# Patient Record
Sex: Male | Born: 1950 | Race: White | Hispanic: No | Marital: Married | Smoking: Never smoker
Health system: Southern US, Academic
[De-identification: ages and names within clinical notes are randomized; demographics above are authoritative.]

## PROBLEM LIST (undated history)

## (undated) DIAGNOSIS — C801 Malignant (primary) neoplasm, unspecified: Secondary | ICD-10-CM

## (undated) DIAGNOSIS — R81 Glycosuria: Secondary | ICD-10-CM

## (undated) DIAGNOSIS — R6889 Other general symptoms and signs: Secondary | ICD-10-CM

## (undated) DIAGNOSIS — I10 Essential (primary) hypertension: Secondary | ICD-10-CM

## (undated) DIAGNOSIS — M1990 Unspecified osteoarthritis, unspecified site: Secondary | ICD-10-CM

## (undated) DIAGNOSIS — Z973 Presence of spectacles and contact lenses: Secondary | ICD-10-CM

## (undated) DIAGNOSIS — K219 Gastro-esophageal reflux disease without esophagitis: Secondary | ICD-10-CM

## (undated) DIAGNOSIS — E119 Type 2 diabetes mellitus without complications: Secondary | ICD-10-CM

## (undated) DIAGNOSIS — G5712 Meralgia paresthetica, left lower limb: Secondary | ICD-10-CM

## (undated) DIAGNOSIS — R809 Proteinuria, unspecified: Secondary | ICD-10-CM

## (undated) DIAGNOSIS — K5900 Constipation, unspecified: Secondary | ICD-10-CM

## (undated) DIAGNOSIS — J0101 Acute recurrent maxillary sinusitis: Secondary | ICD-10-CM

## (undated) DIAGNOSIS — I219 Acute myocardial infarction, unspecified: Secondary | ICD-10-CM

## (undated) DIAGNOSIS — M539 Dorsopathy, unspecified: Secondary | ICD-10-CM

## (undated) DIAGNOSIS — C44519 Basal cell carcinoma of skin of other part of trunk: Secondary | ICD-10-CM

## (undated) DIAGNOSIS — M1732 Unilateral post-traumatic osteoarthritis, left knee: Secondary | ICD-10-CM

## (undated) HISTORY — DX: Essential (primary) hypertension: I10

## (undated) HISTORY — DX: Acute recurrent maxillary sinusitis: J01.01

## (undated) HISTORY — PX: CATARACT EXTRACTION: SUR2

## (undated) HISTORY — PX: HX KNEE REPLACMENT: SHX125

## (undated) HISTORY — DX: Meralgia paresthetica, left lower limb: G57.12

## (undated) HISTORY — PX: HX BACK SURGERY: SHX140

## (undated) HISTORY — DX: Unilateral post-traumatic osteoarthritis, left knee: M17.32

## (undated) HISTORY — DX: Proteinuria, unspecified: R80.9

## (undated) HISTORY — DX: Basal cell carcinoma of skin of other part of trunk: C44.519

## (undated) HISTORY — PX: HX APPENDECTOMY: SHX54

## (undated) HISTORY — DX: Glycosuria: R81

## (undated) HISTORY — DX: Gastro-esophageal reflux disease without esophagitis: K21.9

---

## 1993-10-12 ENCOUNTER — Other Ambulatory Visit (HOSPITAL_COMMUNITY): Payer: Self-pay | Admitting: EXTERNAL

## 2009-07-02 DIAGNOSIS — M545 Low back pain, unspecified: Secondary | ICD-10-CM | POA: Insufficient documentation

## 2009-07-02 DIAGNOSIS — M542 Cervicalgia: Secondary | ICD-10-CM | POA: Insufficient documentation

## 2009-07-12 DIAGNOSIS — M48062 Spinal stenosis, lumbar region with neurogenic claudication: Secondary | ICD-10-CM | POA: Insufficient documentation

## 2009-07-12 DIAGNOSIS — M47817 Spondylosis without myelopathy or radiculopathy, lumbosacral region: Secondary | ICD-10-CM | POA: Insufficient documentation

## 2009-07-12 DIAGNOSIS — M48061 Spinal stenosis, lumbar region without neurogenic claudication: Secondary | ICD-10-CM | POA: Insufficient documentation

## 2009-07-12 DIAGNOSIS — M5137 Other intervertebral disc degeneration, lumbosacral region: Secondary | ICD-10-CM | POA: Insufficient documentation

## 2013-12-22 DIAGNOSIS — I10 Essential (primary) hypertension: Secondary | ICD-10-CM | POA: Insufficient documentation

## 2013-12-22 DIAGNOSIS — E119 Type 2 diabetes mellitus without complications: Secondary | ICD-10-CM | POA: Insufficient documentation

## 2013-12-23 NOTE — Unmapped External Note (Signed)
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

## 2014-01-16 DIAGNOSIS — Z9889 Other specified postprocedural states: Secondary | ICD-10-CM | POA: Insufficient documentation

## 2015-01-29 DIAGNOSIS — M5116 Intervertebral disc disorders with radiculopathy, lumbar region: Secondary | ICD-10-CM | POA: Insufficient documentation

## 2016-10-01 DIAGNOSIS — Z96652 Presence of left artificial knee joint: Secondary | ICD-10-CM | POA: Insufficient documentation

## 2018-12-22 NOTE — Progress Notes (Signed)
Two year f/u left knee clinical outcomes data collected on 12/08/2018.
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
## 2021-08-01 IMAGING — MR MRI SHOULDER LT W/O CONTRAST
5 series · 40 of 40 positions shown · IV contrast (gadolinium)
Comparison: Radiographs dated 05/20/2021.

﻿EXAM:  [id number]   MRI SHOULDER LT W/O CONTRAST
INDICATION: Pain.
TECHNIQUE: Multiplanar multisequential MRI of the left shoulder joint was performed without gadolinium contrast.

[Series 6: T1 · oblique · left · 3.5mm · 0.33mm/px · 8 of 18 slices shown]
[im 1/18]
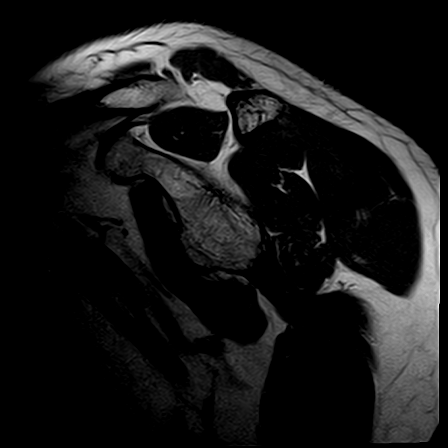
[im 3/18]
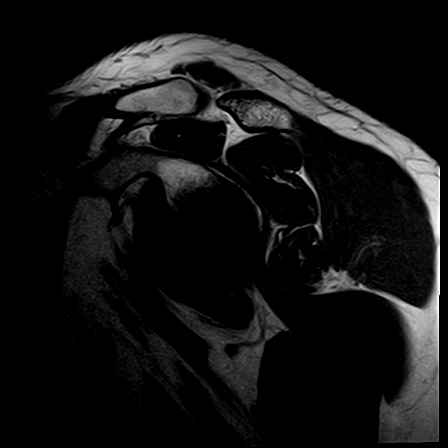
[im 5/18]
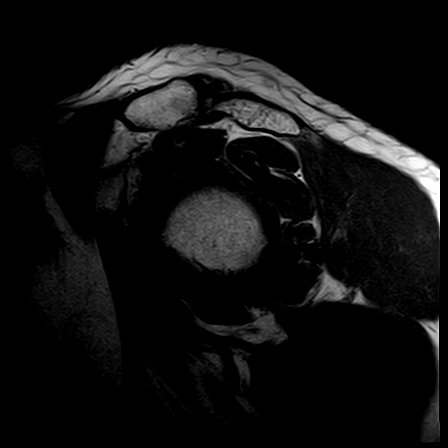
[im 8/18]
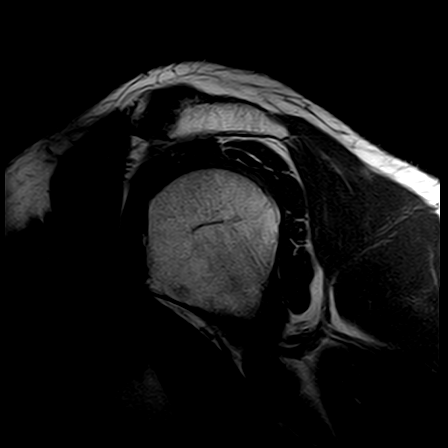
[im 10/18]
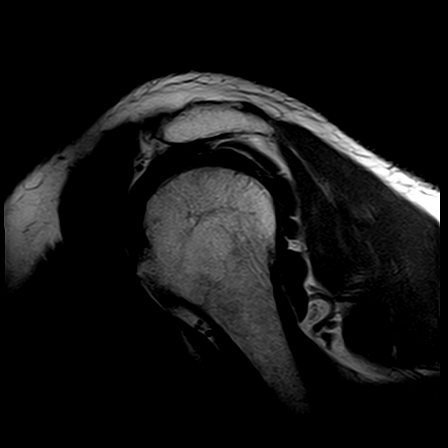
[im 13/18]
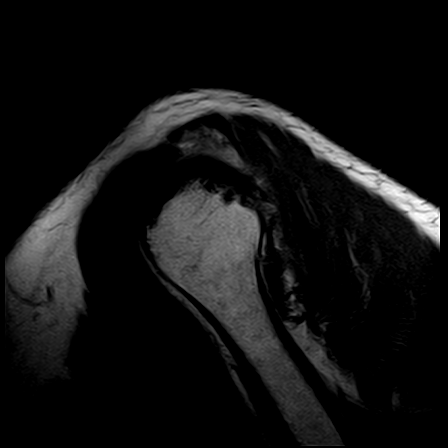
[im 15/18]
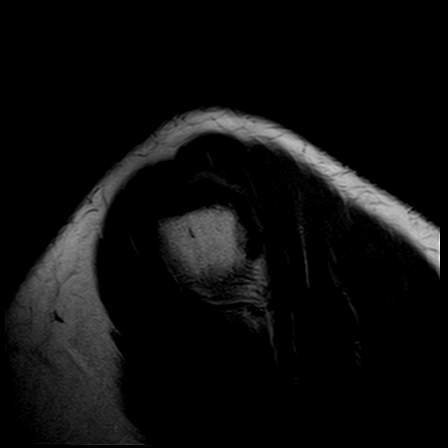
[im 18/18]
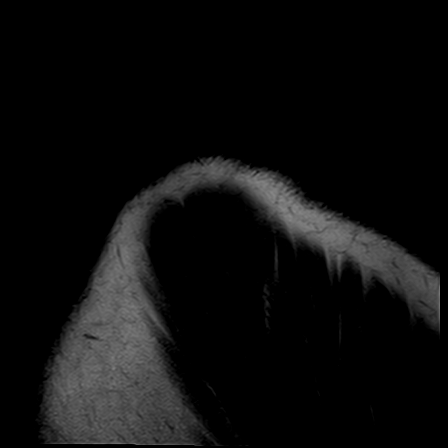

[Series 7: STIR · oblique · left · 3.5mm · 0.47mm/px · 8 of 18 slices shown (1 of 2)]
[im 1/18]
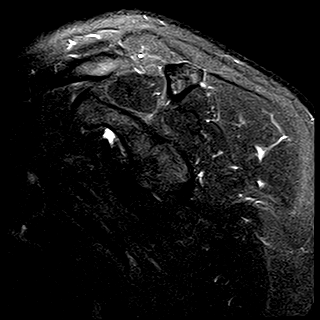
[im 3/18]
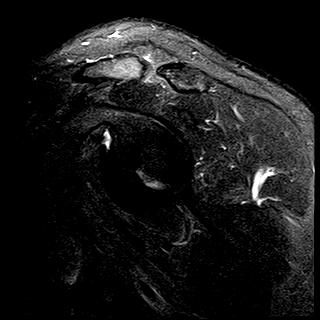
[im 5/18]
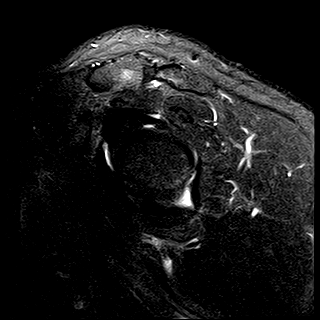
[im 8/18]
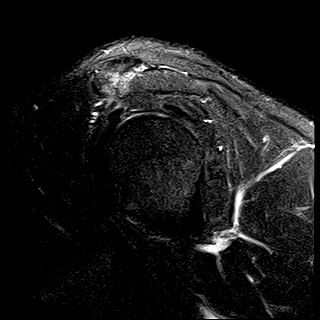
[im 10/18]
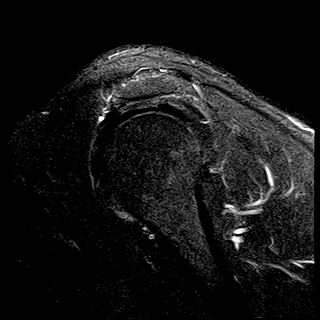
[im 13/18]
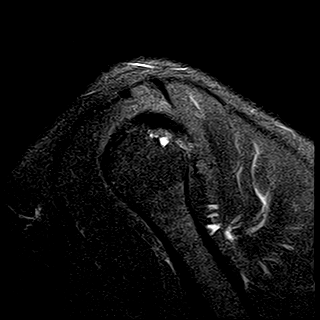
[im 15/18]
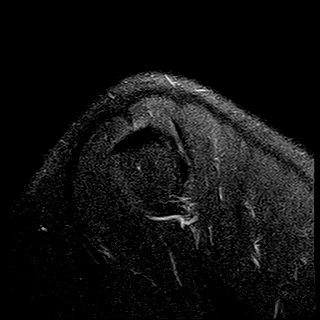
[im 18/18]
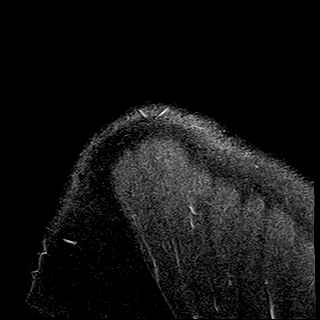

[Series 10: PD fat-sat · oblique · left · 3.5mm · 0.47mm/px · 8 of 18 slices shown]
[im 1/18]
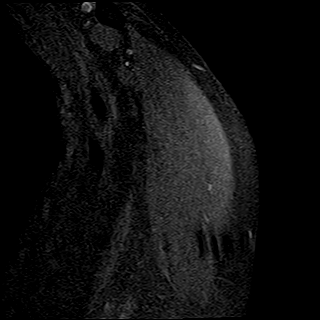
[im 3/18]
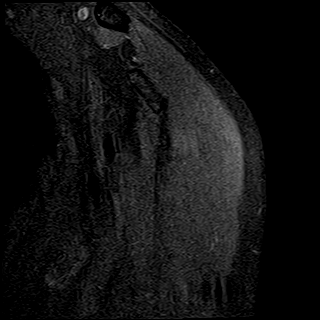
[im 5/18]
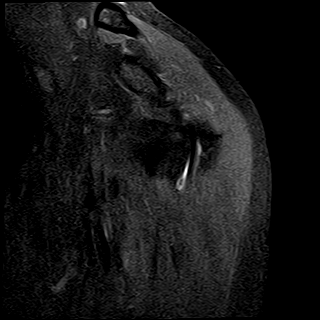
[im 8/18]
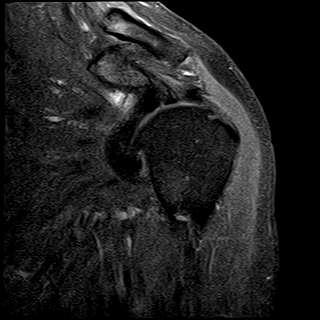
[im 10/18]
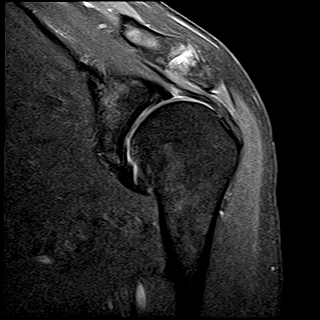
[im 13/18]
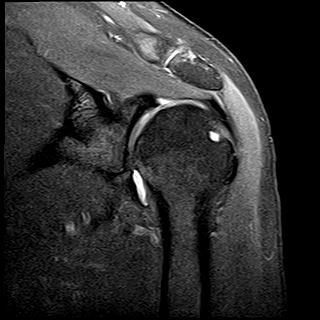
[im 15/18]
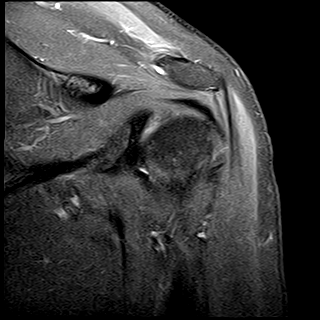
[im 18/18]
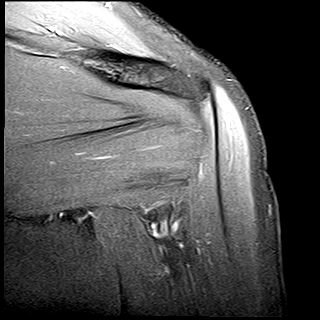

[Series 11: STIR · oblique · left · 3.5mm · 0.47mm/px · 8 of 18 slices shown (2 of 2)]
[im 1/18]
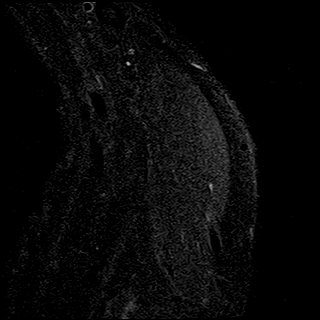
[im 3/18]
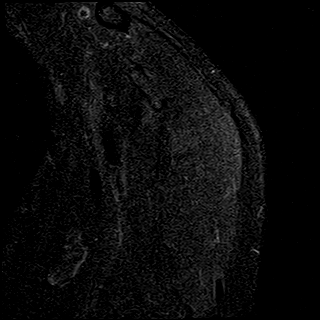
[im 5/18]
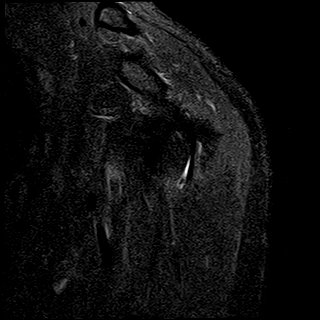
[im 8/18]
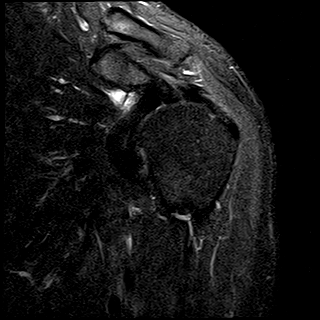
[im 10/18]
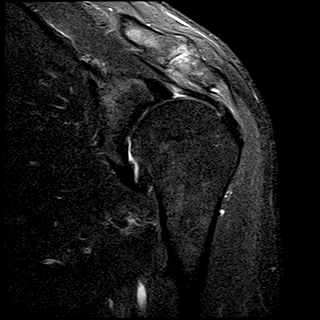
[im 13/18]
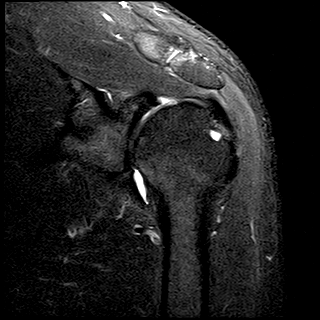
[im 15/18]
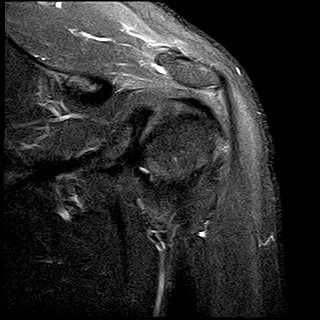
[im 18/18]
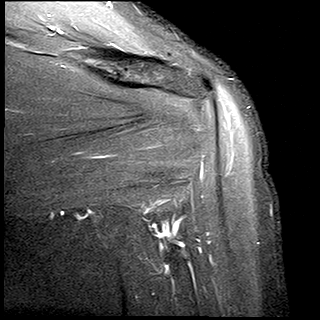

[Series 12: PD · oblique · left · 3.5mm · 0.47mm/px · 8 of 18 slices shown]
[im 1/18]
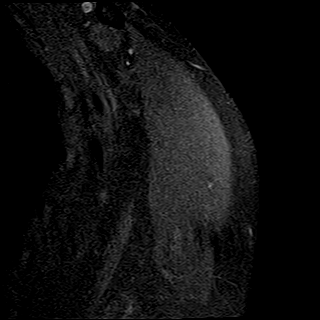
[im 3/18]
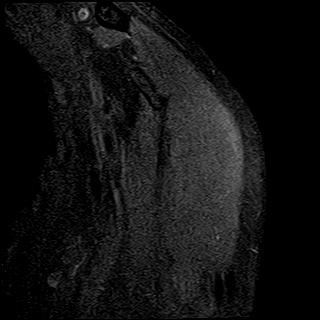
[im 5/18]
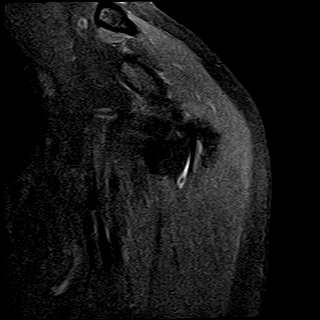
[im 8/18]
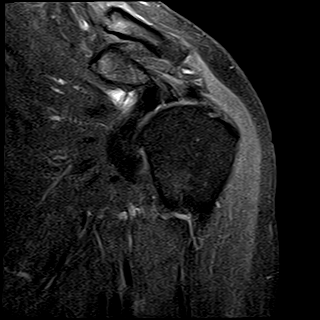
[im 10/18]
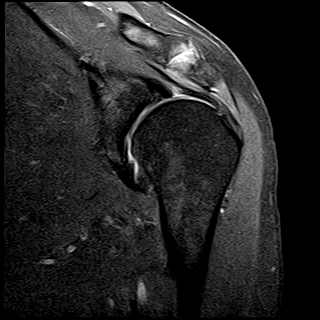
[im 13/18]
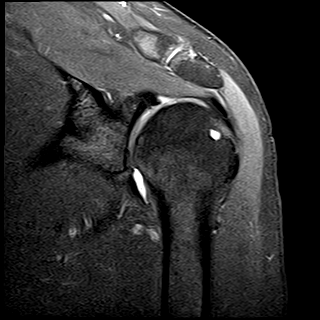
[im 15/18]
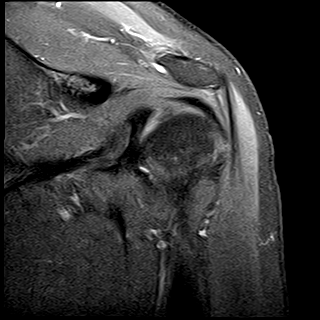
[im 18/18]
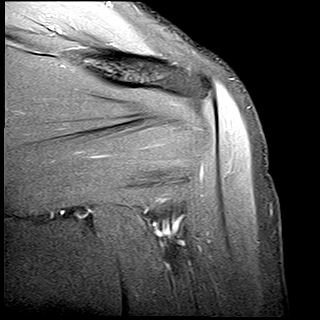

[40 of 40 positions shown; findings below may reference images not displayed]

FINDINGS: Supraspinatus, infraspinatus, teres minor and subscapularis muscles and tendons are within normal limits in morphology and signal intensity. Long head of biceps tendon is well seated within the intertubercular groove and attaches normally to the biceps anchor. Superior labrum is intact. Glenohumeral articular cartilage is well maintained.  There is advanced acromioclavicular joint osteoarthritis.  No significant fluid is noted within the subacromial/subdeltoid bursa. Muscle bulk and bone marrow signal intensity are normal. No mass is seen along the course of the suprascapular nerve, within the spinoglenoid notch or within the quadrilateral space.
IMPRESSION: 1. Intact rotator cuff and superior labrum. 

2. Advanced acromioclavicular joint osteoarthritis.

## 2021-08-01 IMAGING — MR MRI SHOULDER RT W/O CONTRAST
6 series · 38 of 40 positions shown · IV contrast (gadolinium)
Comparison: Radiographs dated 05/20/2021.

﻿EXAM:  [id number]   MRI SHOULDER RT W/O CONTRAST
INDICATION: Chronic pain and decreased range of motion.
TECHNIQUE: Multiplanar multisequential MRI of the right shoulder joint was performed without gadolinium contrast.

[Series 6: T1 · oblique · right · 3.5mm · 0.33mm/px · 7 of 18 slices shown]
[im 1/18]
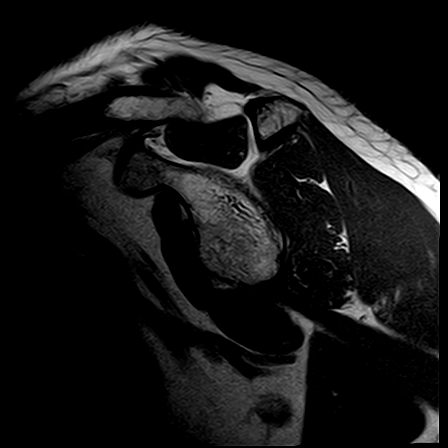
[im 3/18]
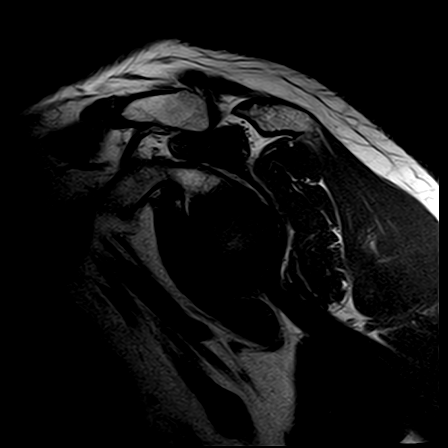
[im 6/18]
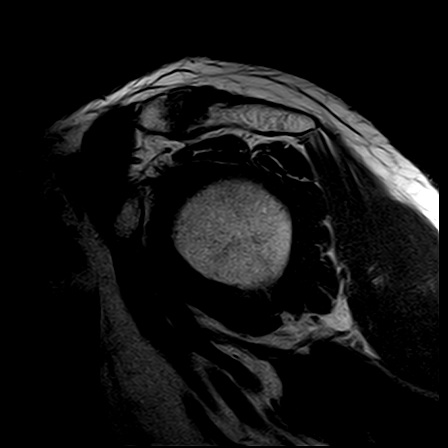
[im 9/18]
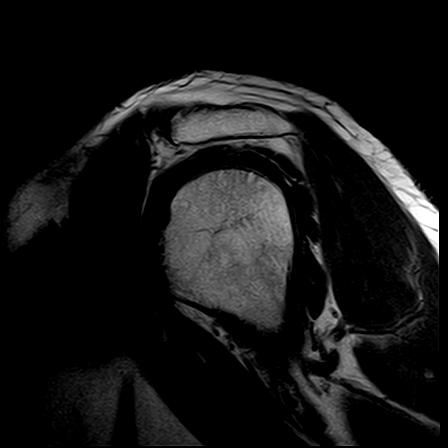
[im 12/18]
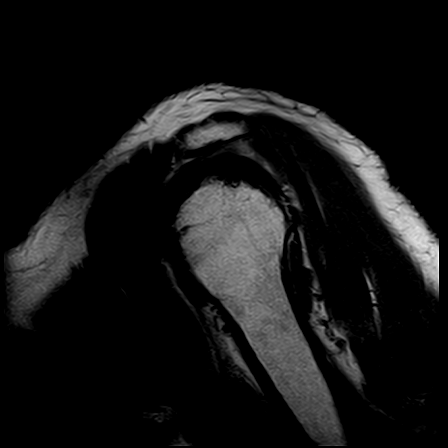
[im 15/18]
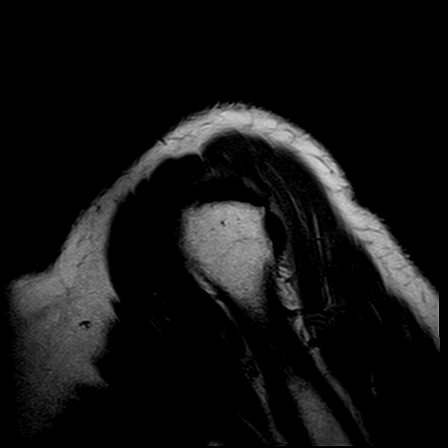
[im 18/18]
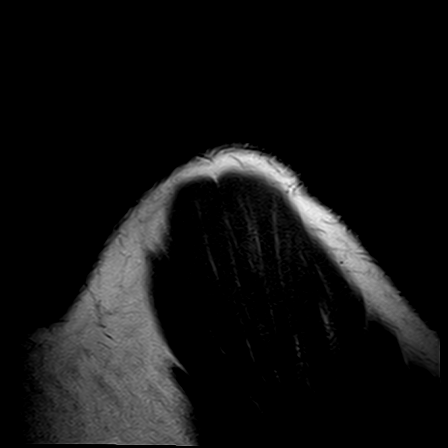

[Series 7: STIR · oblique · right · 3.5mm · 0.47mm/px · 7 of 18 slices shown (1 of 2)]
[im 1/18]
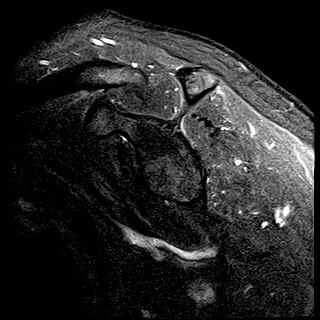
[im 3/18]
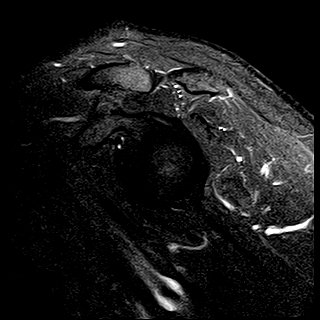
[im 6/18]
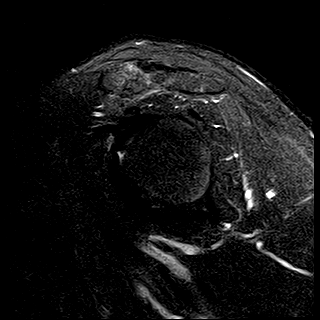
[im 9/18]
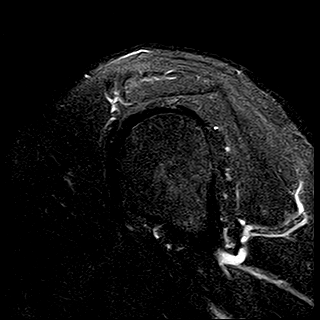
[im 12/18]
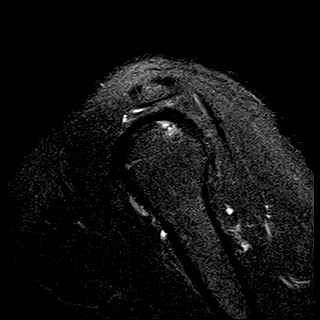
[im 15/18]
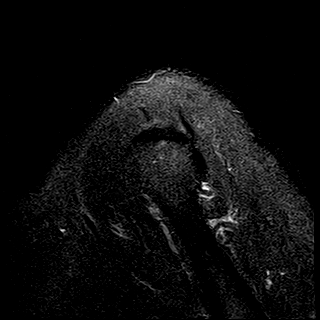
[im 18/18]
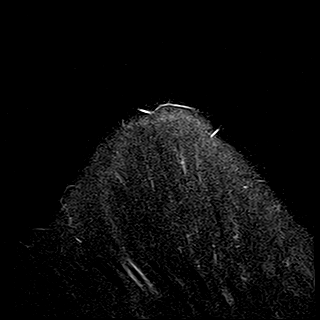

[Series 8: PD fat-sat · axial · right · 4.0mm · 0.50mm/px · z∈[-62,+14]mm · 6 of 18 slices shown (1 of 2)]
[im 1/18]
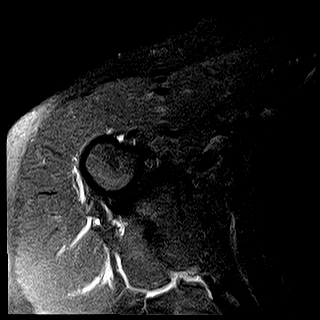
[im 4/18]
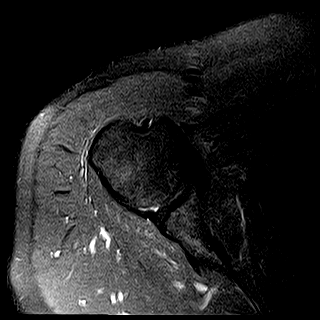
[im 7/18]
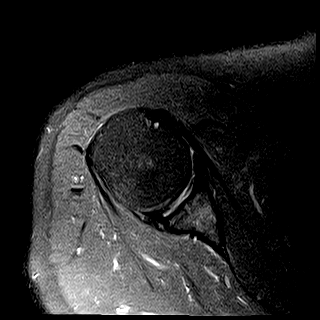
[im 11/18]
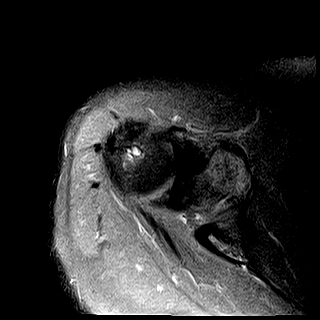
[im 14/18]
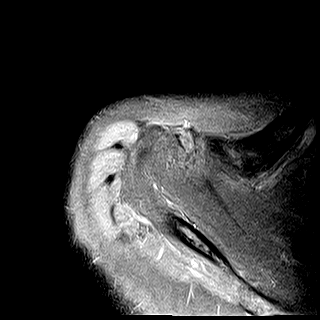
[im 18/18]
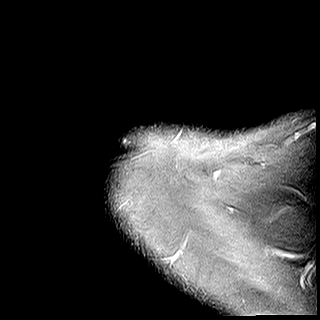

[Series 9: T2 fat-sat · axial · right · 4.0mm · 0.42mm/px · z∈[-76,+28]mm · 8 of 24 slices shown]
[im 1/24]
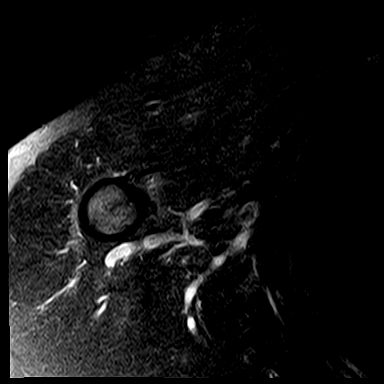
[im 4/24]
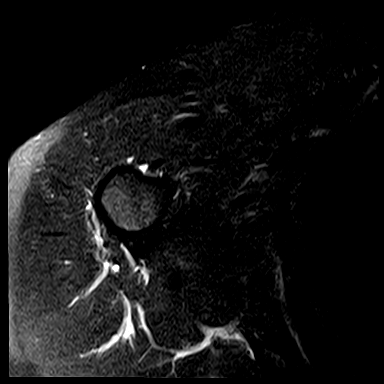
[im 7/24]
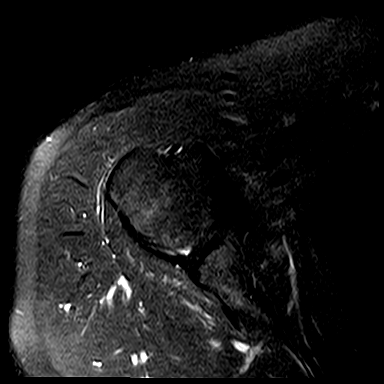
[im 10/24]
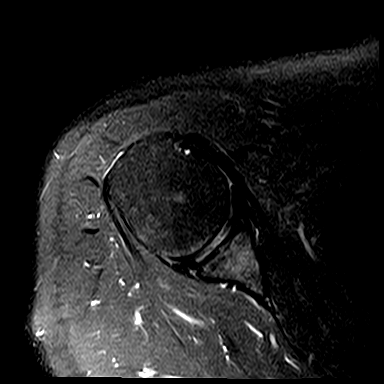
[im 14/24]
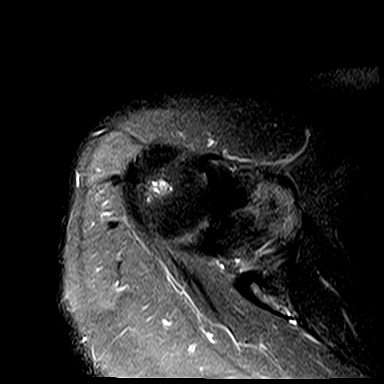
[im 17/24]
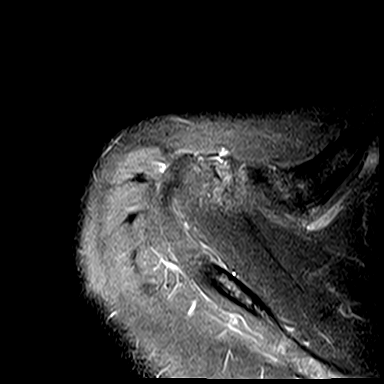
[im 20/24]
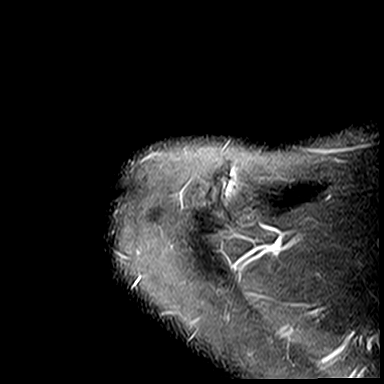
[im 24/24]
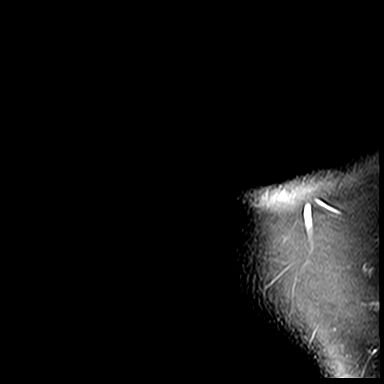

[Series 10: PD fat-sat · oblique · right · 3.5mm · 0.50mm/px · 6 of 18 slices shown (2 of 2)]
[im 1/18]
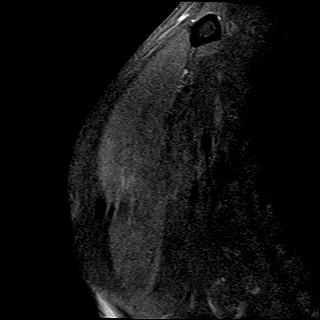
[im 4/18]
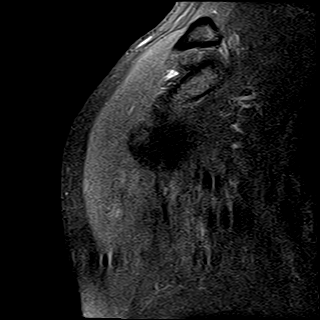
[im 7/18]
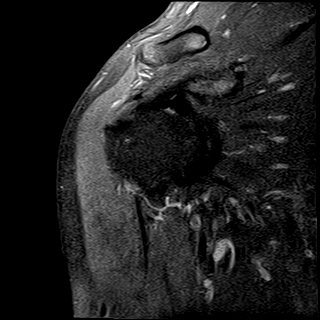
[im 11/18]
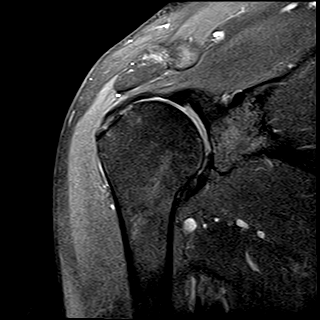
[im 14/18]
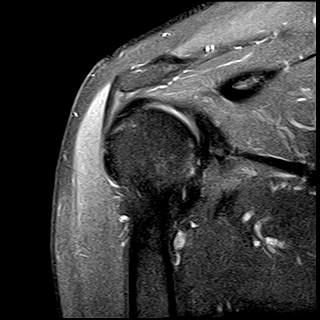
[im 18/18]
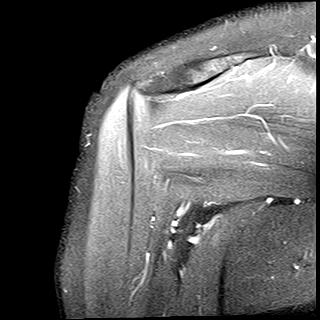

[Series 11: STIR · oblique · right · 3.5mm · 0.50mm/px · 4 of 18 slices shown (2 of 2)]
[im 1/18]
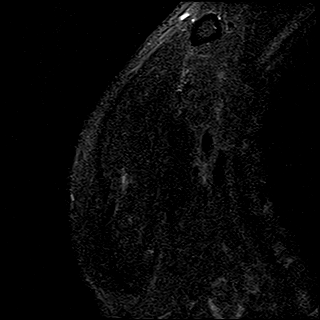
[im 4/18]
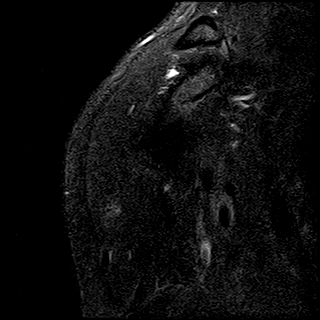
[im 7/18]
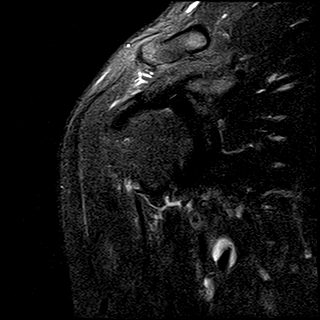
[im 11/18]
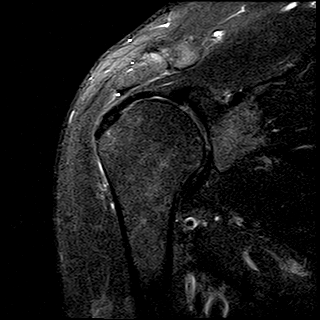

[38 of 40 positions shown; findings below may reference images not displayed]

FINDINGS: Supraspinatus, infraspinatus, teres minor and subscapularis muscles and tendons are within normal limits in morphology and signal intensity. Long head of biceps tendon is well seated within the intertubercular groove and attaches normally to the biceps anchor. Superior labrum is intact. There is suggestion of posterior labral tear. Glenohumeral articular cartilage is well maintained. There is moderate acromioclavicular joint osteoarthritis.  No significant fluid is noted within the subacromial/subdeltoid bursa. Muscle bulk and bone marrow signal intensity are normal. Mild subcortical cystic changes seen within the humeral head underlying infraspinatus tendon insertion.  No mass is seen along the course of the suprascapular nerve, within the spinoglenoid notch or within the quadrilateral space.
IMPRESSION: 1. Intact rotator cuff and superior labrum. 

2. Suggestion of posterior labral tear. 

3. Moderate acromioclavicular joint osteoarthritis.

## 2021-08-07 ENCOUNTER — Other Ambulatory Visit (INDEPENDENT_AMBULATORY_CARE_PROVIDER_SITE_OTHER): Payer: Self-pay | Admitting: Family

## 2021-08-07 DIAGNOSIS — R319 Hematuria, unspecified: Secondary | ICD-10-CM

## 2021-09-23 ENCOUNTER — Ambulatory Visit (HOSPITAL_COMMUNITY): Payer: Self-pay

## 2022-01-02 ENCOUNTER — Encounter (INDEPENDENT_AMBULATORY_CARE_PROVIDER_SITE_OTHER): Payer: Self-pay | Admitting: Family Medicine

## 2022-01-02 DIAGNOSIS — L821 Other seborrheic keratosis: Secondary | ICD-10-CM | POA: Insufficient documentation

## 2022-01-02 DIAGNOSIS — E538 Deficiency of other specified B group vitamins: Secondary | ICD-10-CM | POA: Insufficient documentation

## 2022-01-02 DIAGNOSIS — H18609 Keratoconus, unspecified, unspecified eye: Secondary | ICD-10-CM | POA: Insufficient documentation

## 2022-01-02 DIAGNOSIS — L03113 Cellulitis of right upper limb: Secondary | ICD-10-CM | POA: Insufficient documentation

## 2022-01-02 DIAGNOSIS — R3129 Other microscopic hematuria: Secondary | ICD-10-CM | POA: Insufficient documentation

## 2022-01-02 DIAGNOSIS — M2040 Other hammer toe(s) (acquired), unspecified foot: Secondary | ICD-10-CM | POA: Insufficient documentation

## 2022-01-02 DIAGNOSIS — E113299 Type 2 diabetes mellitus with mild nonproliferative diabetic retinopathy without macular edema, unspecified eye: Secondary | ICD-10-CM | POA: Insufficient documentation

## 2022-01-02 DIAGNOSIS — R002 Palpitations: Secondary | ICD-10-CM | POA: Insufficient documentation

## 2022-01-02 DIAGNOSIS — H903 Sensorineural hearing loss, bilateral: Secondary | ICD-10-CM | POA: Insufficient documentation

## 2022-01-02 DIAGNOSIS — S86819A Strain of other muscle(s) and tendon(s) at lower leg level, unspecified leg, initial encounter: Secondary | ICD-10-CM | POA: Insufficient documentation

## 2022-01-02 DIAGNOSIS — E1140 Type 2 diabetes mellitus with diabetic neuropathy, unspecified: Secondary | ICD-10-CM | POA: Insufficient documentation

## 2022-01-02 DIAGNOSIS — M5010 Cervical disc disorder with radiculopathy, unspecified cervical region: Secondary | ICD-10-CM | POA: Insufficient documentation

## 2022-01-02 DIAGNOSIS — G629 Polyneuropathy, unspecified: Secondary | ICD-10-CM | POA: Insufficient documentation

## 2022-01-02 DIAGNOSIS — N649 Disorder of breast, unspecified: Secondary | ICD-10-CM | POA: Insufficient documentation

## 2022-01-02 DIAGNOSIS — F32A Depression, unspecified: Secondary | ICD-10-CM | POA: Insufficient documentation

## 2022-01-02 DIAGNOSIS — M5126 Other intervertebral disc displacement, lumbar region: Secondary | ICD-10-CM | POA: Insufficient documentation

## 2022-01-02 DIAGNOSIS — R202 Paresthesia of skin: Secondary | ICD-10-CM | POA: Insufficient documentation

## 2022-01-02 DIAGNOSIS — M1990 Unspecified osteoarthritis, unspecified site: Secondary | ICD-10-CM | POA: Insufficient documentation

## 2022-01-02 DIAGNOSIS — M4310 Spondylolisthesis, site unspecified: Secondary | ICD-10-CM | POA: Insufficient documentation

## 2022-01-02 DIAGNOSIS — E559 Vitamin D deficiency, unspecified: Secondary | ICD-10-CM | POA: Insufficient documentation

## 2022-01-02 DIAGNOSIS — L602 Onychogryphosis: Secondary | ICD-10-CM | POA: Insufficient documentation

## 2022-01-02 DIAGNOSIS — N400 Enlarged prostate without lower urinary tract symptoms: Secondary | ICD-10-CM | POA: Insufficient documentation

## 2022-01-02 DIAGNOSIS — K648 Other hemorrhoids: Secondary | ICD-10-CM | POA: Insufficient documentation

## 2022-01-02 DIAGNOSIS — R42 Dizziness and giddiness: Secondary | ICD-10-CM | POA: Insufficient documentation

## 2022-01-02 DIAGNOSIS — L603 Nail dystrophy: Secondary | ICD-10-CM | POA: Insufficient documentation

## 2022-01-02 DIAGNOSIS — N401 Enlarged prostate with lower urinary tract symptoms: Secondary | ICD-10-CM | POA: Insufficient documentation

## 2022-01-02 DIAGNOSIS — D126 Benign neoplasm of colon, unspecified: Secondary | ICD-10-CM | POA: Insufficient documentation

## 2022-01-02 DIAGNOSIS — Z96659 Presence of unspecified artificial knee joint: Secondary | ICD-10-CM | POA: Insufficient documentation

## 2022-01-02 DIAGNOSIS — F5221 Male erectile disorder: Secondary | ICD-10-CM | POA: Insufficient documentation

## 2022-01-02 DIAGNOSIS — B351 Tinea unguium: Secondary | ICD-10-CM | POA: Insufficient documentation

## 2022-01-02 DIAGNOSIS — S21201A Unspecified open wound of right back wall of thorax without penetration into thoracic cavity, initial encounter: Secondary | ICD-10-CM | POA: Insufficient documentation

## 2022-01-02 DIAGNOSIS — Z4789 Encounter for other orthopedic aftercare: Secondary | ICD-10-CM | POA: Insufficient documentation

## 2022-01-02 DIAGNOSIS — L600 Ingrowing nail: Secondary | ICD-10-CM | POA: Insufficient documentation

## 2022-01-08 ENCOUNTER — Ambulatory Visit (INDEPENDENT_AMBULATORY_CARE_PROVIDER_SITE_OTHER): Payer: Commercial Managed Care - PPO | Admitting: Family Medicine

## 2022-01-08 ENCOUNTER — Other Ambulatory Visit: Payer: Self-pay

## 2022-01-08 ENCOUNTER — Encounter (INDEPENDENT_AMBULATORY_CARE_PROVIDER_SITE_OTHER): Payer: Self-pay | Admitting: Family Medicine

## 2022-01-08 VITALS — BP 147/73 | HR 73 | Temp 98.5°F | Resp 20 | Ht 73.0 in | Wt 203.0 lb

## 2022-01-08 DIAGNOSIS — E538 Deficiency of other specified B group vitamins: Secondary | ICD-10-CM

## 2022-01-08 DIAGNOSIS — Z794 Long term (current) use of insulin: Secondary | ICD-10-CM

## 2022-01-08 DIAGNOSIS — F339 Major depressive disorder, recurrent, unspecified: Secondary | ICD-10-CM

## 2022-01-08 DIAGNOSIS — E119 Type 2 diabetes mellitus without complications: Secondary | ICD-10-CM

## 2022-01-08 DIAGNOSIS — E782 Mixed hyperlipidemia: Secondary | ICD-10-CM

## 2022-01-08 DIAGNOSIS — F5221 Male erectile disorder: Secondary | ICD-10-CM

## 2022-01-08 NOTE — Progress Notes (Signed)
FAMILY MEDICINE, MEDICAL OFFICE BUILDING  [address]  [place] [address]       Name: Brian Stanley MRN:  [id number]   Date: 01/08/2022 Age: 71 y.o.          Provider: Elliot Gault, DO    Reason for visit: Follow Up 6 Months      History of Present Illness:  01/08/2022:  This 71 year old male cancer six-month follow-up reviewed his labs and get refills all his medications.  He has absent [employer] last week and his A1c dropped from 6.8-6.6 per the patient.  We do not have the labs he had done last week so were going to try to get this.  His blood pressure last week was 94/54 city reduce lisinopril from 10 mg to 5 mg.  He has lost 6 lb since he was here last.  His major complaint today is arthritis pain in his hands mostly the metacarpophalangeal joints.  He denies chest pain shortness a breath syncope near syncope nausea vomiting diarrhea constipation.  He continues to follow with Urology for his microscopic hematuria.  He discontinued the Cialis because it was not helping with his benign prostatic hyperplasia.  Historical Data    Past Medical History:  Past Medical History:   Diagnosis Date   . Acute recurrent maxillary sinusitis    . Basal cell carcinoma of back    . Esophageal reflux    . Glycosuria    . Hypertension    . Meralgia paresthetica of left side    . Post-traumatic osteoarthritis of left knee    . Proteinuria          Past Surgical History:  Past Surgical History:   Procedure Laterality Date   . HX APPENDECTOMY     . HX BACK SURGERY     . HX KNEE REPLACMENT      left         Allergies:  Allergies   Allergen Reactions   . Atorvastatin Myalgia     Other reaction(s): Muscle pain   . Milk  Other Adverse Reaction (Add comment)     Lactose Intolerance? GI symptoms   . Pravastatin Myalgia     Other reaction(s): Muscle pain   . Rosuvastatin Myalgia     Other reaction(s): Muscle pain   . Sildenafil  Other Adverse Reaction (Add comment)     Other reaction(s):  Headache   . Simvastatin Myalgia     Other reaction(s): Muscle pain   . Metformin Diarrhea     Medications:  Current Outpatient Medications   Medication Sig   . aspirin (ECOTRIN) 81 mg Oral Tablet, Delayed Release (E.C.) Take 1 Tablet (81 mg total) by mouth   . CHELATED ZINC ORAL Take 50 mg by mouth Once a day   . empagliflozin (JARDIANCE) 25 mg Oral Tablet Take 1 Tablet (25 mg total) by mouth Every morning   . Folic Acid 767 mcg Oral Tablet Take 1 Tablet (800 mcg total) by mouth   . insulin glargine-yfgn 100 Units/mL Subcutaneous Insulin Pen INJECT 25 UNITS SUBCUTANEOUSLY EVERY DAY   . krill/omega-3/dha/epa/lipids (MEGAKRILL ORAL) Take by mouth Once a day   . lidocaine (LIDODERM) 5 % Adhesive Patch, Medicated Place on the skin   . lisinopriL (PRINIVIL) 10 mg Oral Tablet Take 0.5 Tablets (5 mg total) by mouth Once a day   . MULTIVITAMIN ORAL TAKE  BY MOUTH EVERY DAY   . naproxen (NAPROSYN) 500 mg Oral Tablet Take  1 Tablet (500 mg total) by mouth Twice daily with food   . semaglutide (OZEMPIC) 1 mg/dose (4 mg/3 mL) Subcutaneous Pen Injector INJECT '1MG'$ /0.75ML SUBCUTANEOUSLY ONCE A WEEK   . Simethicone 125 mg Oral Capsule Take 1 Capsule (125 mg total) by mouth Once a day   . vitamin E 670 mg (1,000 unit) Oral Capsule Take 1 Capsule (1,000 Units total) by mouth Twice daily     Family History:  Family Medical History:     Problem Relation (Age of Onset)    Carpal tunnel syndrome Sister    Depression Mother    Diabetes type II Father    Elevated Lipids Father    Heart Attack Father    Hypertension (High Blood Pressure) Father    Stroke Mother          Social History:  Social History     Socioeconomic History   . Marital status: Married   Tobacco Use   . Smoking status: Never   . Smokeless tobacco: Never   Vaping Use   . Vaping Use: Never used   Substance and Sexual Activity   . Alcohol use: Yes     Comment: occasionally   . Drug use: Never           Review of Systems:  Any pertinent Review of Systems as addressed in the  HPI above.    Physical Exam:  Vital Signs:  Vitals:    01/08/22 0817   BP: (!) 147/73   Pulse: 73   Resp: 20   Temp: 36.9 C (98.5 F)   TempSrc: Temporal   SpO2: 95%   Weight: 92.1 kg (203 lb)   Height: 1.854 m ('6\' 1"'$ )   BMI: 26.84     Physical Exam  Constitutional:       Appearance: Normal appearance. He is overweight.      Comments: BMI is 26.78 kilograms/meter squared this is overweight.   HENT:      Head: Normocephalic and atraumatic.      Right Ear: Tympanic membrane, ear canal and external ear normal.      Left Ear: Tympanic membrane, ear canal and external ear normal.      Nose: Nose normal.      Mouth/Throat:      Mouth: Mucous membranes are moist.      Pharynx: Oropharynx is clear.   Eyes:      Extraocular Movements: Extraocular movements intact.      Conjunctiva/sclera: Conjunctivae normal.      Pupils: Pupils are equal, round, and reactive to light.   Cardiovascular:      Rate and Rhythm: Normal rate and regular rhythm.      Pulses: Normal pulses.      Heart sounds: Normal heart sounds.   Pulmonary:      Effort: Pulmonary effort is normal.      Breath sounds: Normal breath sounds.   Abdominal:      General: Abdomen is flat.      Palpations: Abdomen is soft.   Musculoskeletal:         General: Normal range of motion.      Right hand: Swelling, tenderness and bony tenderness present.      Left hand: Swelling, tenderness and bony tenderness present.      Cervical back: Normal range of motion and neck supple.   Skin:     General: Skin is warm and dry.      Capillary Refill: Capillary refill takes less than  2 seconds.   Neurological:      General: No focal deficit present.      Mental Status: He is alert and oriented to person, place, and time. Mental status is at baseline.   Psychiatric:         Mood and Affect: Mood normal.         Behavior: Behavior normal.       Assessment:    ICD-10-CM    1. 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 Major depression, recurrent (CMS HCC)  F33.9       4. Vitamin B12 deficiency (non anemic)  E53.8       5. Hyperlipidemia, mixed  E78.2          Plan:  Orders Placed This Encounter   . BASIC METABOLIC PANEL   . HGA1C (HEMOGLOBIN A1C WITH EST AVG GLUCOSE)   . LIPID PANEL   . HEPATIC FUNCTION PANEL   . THYROID STIMULATING HORMONE (SENSITIVE TSH)   . MICROALBUMIN/CREATININE RATIO, URINE, RANDOM     He will have his labs done to be a Pap who ordered a basic metabolic panel hemoglobin A1c lipid panel hepatic function panel thyroid-stimulating hormone microalbumin creatinine ratio to be done in 6 months he does not have labs to be 8.  He is to stay on a heart healthy low-fat low-cholesterol diet and avoid high fructose corn syrup and concentrated is sweets.  He says he is going to try, liver oral for his joint pain.  He can also try glucosamine chondroitin sulfate and bulimia new or given Voltaren gel over-the-counter.  His blood sugars are doing much better.  I encouraged him to continue his excellent weight loss.  His depression is under good control he will continue supplementing with B12 at some point as he was awake he may need to start taking brain off of the insulin.  More than 50% of the visit was spent counseling coordinating patient care.  All questions were answered to satisfaction of the patient.    Return in about 6 months (around 07/11/2022).    Elliot Gault, DO     Portions of this note may be dictated using voice recognition software or a dictation service. Variances in spelling and vocabulary are possible and unintentional. Not all errors are caught/corrected. Please notify the [facility] if any discrepancies are noted or if the meaning of any statement is not clear.

## 2022-01-08 NOTE — Nursing Note (Signed)
01/08/22 0824   PHQ 9 (follow up)   Little interest or pleasure in doing things. 0   Feeling down, depressed, or hopeless 0

## 2022-01-08 NOTE — Nursing Note (Signed)
01/08/22 0824   Fall Risk Assessment   Do you feel unsteady when standing or walking? Yes   Do you worry about falling? Yes   Have you fallen in the past year? No

## 2022-01-27 ENCOUNTER — Encounter (INDEPENDENT_AMBULATORY_CARE_PROVIDER_SITE_OTHER): Payer: Self-pay | Admitting: Family

## 2022-02-05 ENCOUNTER — Ambulatory Visit (INDEPENDENT_AMBULATORY_CARE_PROVIDER_SITE_OTHER): Payer: Commercial Managed Care - PPO | Admitting: Family Medicine

## 2022-02-05 ENCOUNTER — Encounter (INDEPENDENT_AMBULATORY_CARE_PROVIDER_SITE_OTHER): Payer: Self-pay | Admitting: Family Medicine

## 2022-02-05 ENCOUNTER — Other Ambulatory Visit: Payer: Self-pay

## 2022-02-05 VITALS — BP 131/67 | HR 69 | Temp 98.9°F | Resp 20 | Ht 73.0 in | Wt 201.0 lb

## 2022-02-05 DIAGNOSIS — M4310 Spondylolisthesis, site unspecified: Secondary | ICD-10-CM | POA: Insufficient documentation

## 2022-02-05 DIAGNOSIS — M5126 Other intervertebral disc displacement, lumbar region: Secondary | ICD-10-CM | POA: Insufficient documentation

## 2022-02-05 DIAGNOSIS — M5116 Intervertebral disc disorders with radiculopathy, lumbar region: Secondary | ICD-10-CM

## 2022-02-05 DIAGNOSIS — G5701 Lesion of sciatic nerve, right lower limb: Secondary | ICD-10-CM

## 2022-02-05 DIAGNOSIS — M2392 Unspecified internal derangement of left knee: Secondary | ICD-10-CM | POA: Insufficient documentation

## 2022-02-05 DIAGNOSIS — M5441 Lumbago with sciatica, right side: Secondary | ICD-10-CM | POA: Insufficient documentation

## 2022-02-05 MED ORDER — KETOROLAC 60 MG/2 ML INTRAMUSCULAR SOLUTION
30.0000 mg | Freq: Once | INTRAMUSCULAR | Status: AC
Start: 2022-02-05 — End: 2022-02-05
  Administered 2022-02-05: 30 mg via INTRAMUSCULAR

## 2022-02-05 NOTE — Progress Notes (Signed)
FAMILY MEDICINE, MEDICAL OFFICE BUILDING  [address]  [place] [address]       Name: Brian Stanley MRN:  [id number]   Date: 02/05/2022 Age: 71 y.o.          Provider: Elliot Gault, DO    Reason for visit: Back Pain      History of Present Illness:  02/05/2022:  This 71 year old male returns because of pain in his buttocks radiate into the groin.  States started delete when the knee is 8 on a scale of 10 constant shooting throbbing he saw his chiropractor today and was told that h could get a better 20% so he decided to come over and has for a Toradol injection to see if that helps.  His pain is mostly located over the right piriformis muscle.  His fairly good range of motion of his hip in flexion and extension and internal and external rotation.  He feels like the pain goes into his testicle in the groin.  He has had no blood in his urine.  I told him when going given injection today if it does not help her make an injection in the piriformis muscle.  Historical Data    Past Medical History:  Past Medical History:   Diagnosis Date    Acute recurrent maxillary sinusitis     Basal cell carcinoma of back     Esophageal reflux     Glycosuria     Hypertension     Meralgia paresthetica of left side     Post-traumatic osteoarthritis of left knee     Proteinuria          Past Surgical History:  Past Surgical History:   Procedure Laterality Date    HX APPENDECTOMY      HX BACK SURGERY      HX KNEE REPLACMENT      left         Allergies:  Allergies   Allergen Reactions    Atorvastatin Myalgia     Other reaction(s): Muscle pain    Milk  Other Adverse Reaction (Add comment)     Lactose Intolerance? GI symptoms    Pravastatin Myalgia     Other reaction(s): Muscle pain    Rosuvastatin Myalgia     Other reaction(s): Muscle pain    Sildenafil  Other Adverse Reaction (Add comment)     Other reaction(s): Headache    Simvastatin Myalgia     Other reaction(s): Muscle pain    Metformin Diarrhea      Medications:  Current Outpatient Medications   Medication Sig    aspirin (ECOTRIN) 81 mg Oral Tablet, Delayed Release (E.C.) Take 1 Tablet (81 mg total) by mouth    CHELATED ZINC ORAL Take 50 mg by mouth Once a day    empagliflozin (JARDIANCE) 25 mg Oral Tablet Take 1 Tablet (25 mg total) by mouth Every morning    Folic Acid 299 mcg Oral Tablet Take 1 Tablet (800 mcg total) by mouth    insulin glargine-yfgn 100 Units/mL Subcutaneous Insulin Pen INJECT 25 UNITS SUBCUTANEOUSLY EVERY DAY    krill/omega-3/dha/epa/lipids (MEGAKRILL ORAL) Take by mouth Once a day    lidocaine (LIDODERM) 5 % Adhesive Patch, Medicated Place on the skin    lisinopriL (PRINIVIL) 10 mg Oral Tablet Take 0.5 Tablets (5 mg total) by mouth Once a day    MULTIVITAMIN ORAL TAKE  BY MOUTH EVERY DAY    naproxen (NAPROSYN) 500 mg Oral Tablet Take 1 Tablet (500  mg total) by mouth Twice daily with food (Patient not taking: Reported on 02/05/2022)    semaglutide (OZEMPIC) 1 mg/dose (4 mg/3 mL) Subcutaneous Pen Injector INJECT '1MG'$ /0.75ML SUBCUTANEOUSLY ONCE A WEEK    Simethicone 125 mg Oral Capsule Take 1 Capsule (125 mg total) by mouth Once a day    vitamin E 670 mg (1,000 unit) Oral Capsule Take 1 Capsule (1,000 Units total) by mouth Twice daily     Family History:  Family Medical History:       Problem Relation (Age of Onset)    Carpal tunnel syndrome Sister    Depression Mother    Diabetes type II Father    Elevated Lipids Father    Heart Attack Father    Hypertension (High Blood Pressure) Father    Stroke Mother            Social History:  Social History     Socioeconomic History    Marital status: Married   Tobacco Use    Smoking status: Never    Smokeless tobacco: Never   Vaping Use    Vaping Use: Never used   Substance and Sexual Activity    Alcohol use: Yes     Comment: occasionally    Drug use: Never           Review of Systems:  Any pertinent Review of Systems as addressed in the HPI above.    Physical Exam:  Vital Signs:  Vitals:    02/05/22  1657   BP: 131/67   Pulse: 69   Resp: 20   Temp: 37.2 C (98.9 F)   TempSrc: Temporal   SpO2: 93%   Weight: 91.2 kg (201 lb)   Height: 1.854 m ('6\' 1"'$ )   BMI: 26.57     Physical Exam  Musculoskeletal:      Lumbar back: Spasms and tenderness present.        Back:       Comments: Tenderness over the piriformis muscle on the right.     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 Piriformis syndrome of right side  G57.01          Plan:  Orders Placed This Encounter    ketorolac (TORADOL) '60mg'$ /2 mL IM injection     We will give him 60 mg of Toradol and see if this helps if not he will call us back in the sitting to possibly Interventional Radiology to have an injection in the piriformis muscle area.    No follow-ups on file.    Elliot Gault, DO     Portions of this note may be dictated using voice recognition software or a dictation service. Variances in spelling and vocabulary are possible and unintentional. Not all errors are caught/corrected. Please notify the [facility] if any discrepancies are noted or if the meaning of any statement is not clear.

## 2022-06-18 ENCOUNTER — Other Ambulatory Visit (INDEPENDENT_AMBULATORY_CARE_PROVIDER_SITE_OTHER): Payer: Self-pay | Admitting: Family Medicine

## 2022-07-15 ENCOUNTER — Ambulatory Visit (INDEPENDENT_AMBULATORY_CARE_PROVIDER_SITE_OTHER): Payer: Commercial Managed Care - PPO | Admitting: Family Medicine

## 2022-07-15 ENCOUNTER — Other Ambulatory Visit: Payer: Self-pay

## 2022-07-15 ENCOUNTER — Encounter (INDEPENDENT_AMBULATORY_CARE_PROVIDER_SITE_OTHER): Payer: Self-pay | Admitting: Family Medicine

## 2022-07-15 VITALS — BP 118/66 | HR 79 | Temp 98.6°F | Resp 20 | Ht 73.0 in | Wt 204.0 lb

## 2022-07-15 DIAGNOSIS — L219 Seborrheic dermatitis, unspecified: Secondary | ICD-10-CM

## 2022-07-15 DIAGNOSIS — F339 Major depressive disorder, recurrent, unspecified: Secondary | ICD-10-CM

## 2022-07-15 DIAGNOSIS — E1140 Type 2 diabetes mellitus with diabetic neuropathy, unspecified: Secondary | ICD-10-CM

## 2022-07-15 DIAGNOSIS — G629 Polyneuropathy, unspecified: Secondary | ICD-10-CM

## 2022-07-15 DIAGNOSIS — N401 Enlarged prostate with lower urinary tract symptoms: Secondary | ICD-10-CM

## 2022-07-15 DIAGNOSIS — Z96659 Presence of unspecified artificial knee joint: Secondary | ICD-10-CM

## 2022-07-15 DIAGNOSIS — L988 Other specified disorders of the skin and subcutaneous tissue: Secondary | ICD-10-CM | POA: Insufficient documentation

## 2022-07-15 DIAGNOSIS — R002 Palpitations: Secondary | ICD-10-CM

## 2022-07-15 DIAGNOSIS — T8484XD Pain due to internal orthopedic prosthetic devices, implants and grafts, subsequent encounter: Secondary | ICD-10-CM

## 2022-07-15 DIAGNOSIS — E782 Mixed hyperlipidemia: Secondary | ICD-10-CM

## 2022-07-15 DIAGNOSIS — S76119A Strain of unspecified quadriceps muscle, fascia and tendon, initial encounter: Secondary | ICD-10-CM | POA: Insufficient documentation

## 2022-07-15 MED ORDER — TAMSULOSIN 0.4 MG CAPSULE
0.4000 mg | ORAL_CAPSULE | Freq: Every evening | ORAL | 2 refills | Status: DC
Start: 2022-07-15 — End: 2023-01-06

## 2022-07-15 MED ORDER — LISINOPRIL 10 MG TABLET
5.0000 mg | ORAL_TABLET | Freq: Every day | ORAL | 1 refills | Status: DC
Start: 2022-07-15 — End: 2022-09-12

## 2022-07-15 NOTE — Nursing Note (Signed)
07/15/22 0812   PHQ 9 (follow up)   Little interest or pleasure in doing things. 0   Feeling down, depressed, or hopeless 0

## 2022-07-15 NOTE — Progress Notes (Signed)
FAMILY MEDICINE, MEDICAL OFFICE BUILDING  [address]  [place] [address]       Name: Brian Stanley MRN:  [id number]   Date: 07/15/2022 Age: 72 y.o.          Provider: Elliot Gault, DO    Reason for visit: Follow Up 6 Months      History of Present Illness:  07/15/2022:  This 72 year old male returns for six-month follow-up to review labs and get refills all medications.  He has gained 3 lb since he was here last in the meds he has not been sticking to his diet he likes sweets.  States his head feels like sunburn it is red this has been going to for a Dajiah Kooi time he was seen at Dermatology and was supposed to see and rescheduled for the next month.  A biopsy was done but we do not have the results of that.  He has been on doxycycline twice a day for 30 days 3 weeks ago the [facility] get his labs done his B12 was low at a low normal 300 his A1c went from 6.5-6.8.  He is restarted his B12 shots.  He needs a refill on his lisinopril Flomax.  His low back pain is constant 2 on a scale of 1-10 in his feet continue to burn.  Historical Data    Past Medical History:  Past Medical History:   Diagnosis Date    Acute recurrent maxillary sinusitis     Basal cell carcinoma of back     Esophageal reflux     Glycosuria     Hypertension     Meralgia paresthetica of left side     Post-traumatic osteoarthritis of left knee     Proteinuria          Past Surgical History:  Past Surgical History:   Procedure Laterality Date    HX APPENDECTOMY      HX BACK SURGERY      HX KNEE REPLACMENT      left         Allergies:  Allergies   Allergen Reactions    Atorvastatin Myalgia     Other reaction(s): Muscle pain    Milk  Other Adverse Reaction (Add comment)     Lactose Intolerance? GI symptoms    Pravastatin Myalgia     Other reaction(s): Muscle pain    Rosuvastatin Myalgia     Other reaction(s): Muscle pain    Sildenafil  Other Adverse Reaction (Add comment)     Other reaction(s): Headache     Simvastatin Myalgia     Other reaction(s): Muscle pain    Metformin Diarrhea     Medications:  Current Outpatient Medications   Medication Sig    aspirin (ECOTRIN) 81 mg Oral Tablet, Delayed Release (E.C.) Take 1 Tablet (81 mg total) by mouth    CHELATED ZINC ORAL Take 50 mg by mouth Once a day    empagliflozin (JARDIANCE) 25 mg Oral Tablet Take 1 Tablet (25 mg total) by mouth Every morning    Folic Acid 449 mcg Oral Tablet Take 1 Tablet (800 mcg total) by mouth    insulin glargine-yfgn 100 Units/mL Subcutaneous Insulin Pen INJECT 25 UNITS SUBCUTANEOUSLY EVERY DAY    krill/omega-3/dha/epa/lipids (MEGAKRILL ORAL) Take by mouth Once a day    lidocaine (LIDODERM) 5 % Adhesive Patch, Medicated Place on the skin    lisinopriL (PRINIVIL) 10 mg Oral Tablet Take 0.5 Tablets (5 mg total) by mouth  Once a day for 180 days    MULTIVITAMIN ORAL TAKE  BY MOUTH EVERY DAY    semaglutide (OZEMPIC) 1 mg/dose (4 mg/3 mL) Subcutaneous Pen Injector INJECT '1MG'$ /0.75ML SUBCUTANEOUSLY ONCE A WEEK    Simethicone 125 mg Oral Capsule Take 1 Capsule (125 mg total) by mouth Once a day    tamsulosin (FLOMAX) 0.4 mg Oral Capsule Take 1 Capsule (0.4 mg total) by mouth Every night    vitamin E 670 mg (1,000 unit) Oral Capsule Take 1 Capsule (1,000 Units total) by mouth Twice daily     Family History:  Family Medical History:       Problem Relation (Age of Onset)    Carpal tunnel syndrome Sister    Depression Mother    Diabetes type II Father    Elevated Lipids Father    Heart Attack Father    Hypertension (High Blood Pressure) Father    Stroke Mother            Social History:  Social History     Socioeconomic History    Marital status: Married   Tobacco Use    Smoking status: Never    Smokeless tobacco: Never   Vaping Use    Vaping Use: Never used   Substance and Sexual Activity    Alcohol use: Yes     Comment: occasionally    Drug use: Never           Review of Systems:  Any pertinent Review of Systems as addressed in the HPI above.    Physical  Exam:  Vital Signs:  Vitals:    07/15/22 0816   BP: 118/66   Pulse: 79   Resp: 20   Temp: 37 C (98.6 F)   TempSrc: Temporal   SpO2: 94%   Weight: 92.5 kg (204 lb)   Height: 1.854 m ('6\' 1"'$ )   BMI: 26.97     Physical Exam  Vitals and nursing note reviewed.   Constitutional:       General: He is awake.      Appearance: Normal appearance. He is well-developed, well-groomed and overweight.      Comments: BMI is elevated at 26.91 kg per m2.  This is in the overweight range.   HENT:      Head: Normocephalic and atraumatic.      Right Ear: Tympanic membrane, ear canal and external ear normal.      Left Ear: Tympanic membrane, ear canal and external ear normal.      Nose: Nose normal.      Mouth/Throat:      Mouth: Mucous membranes are moist.      Pharynx: Oropharynx is clear.   Eyes:      Extraocular Movements: Extraocular movements intact.      Conjunctiva/sclera: Conjunctivae normal.      Pupils: Pupils are equal, round, and reactive to light.   Cardiovascular:      Rate and Rhythm: Normal rate and regular rhythm.      Pulses: Normal pulses.      Heart sounds: Normal heart sounds, S1 normal and S2 normal.   Pulmonary:      Effort: Pulmonary effort is normal.      Breath sounds: Normal breath sounds.   Abdominal:      General: Abdomen is protuberant.      Palpations: Abdomen is soft.   Musculoskeletal:         General: Normal range of motion.      Cervical  back: Normal range of motion and neck supple.   Feet:      Right foot:      Protective Sensation: 9 sites tested.  9 sites sensed.      Left foot:      Protective Sensation: 9 sites tested.  9 sites sensed.   Skin:     General: Skin is warm and dry.      Capillary Refill: Capillary refill takes less than 2 seconds.   Neurological:      General: No focal deficit present.      Mental Status: He is alert and oriented to person, place, and time. Mental status is at baseline.      GCS: GCS eye subscore is 4. GCS verbal subscore is 5. GCS motor subscore is 6.      Cranial  Nerves: Cranial nerves 2-12 are intact.      Sensory: Sensation is intact.      Motor: Motor function is intact.      Coordination: Coordination is intact.      Gait: Gait is intact.   Psychiatric:         Mood and Affect: Mood normal.         Behavior: Behavior normal. Behavior is cooperative.       Assessment:    ICD-10-CM    1. Type 2 diabetes mellitus with diabetic neuropathy, unspecified (CMS HCC)  E11.40       2. Major depression, recurrent (CMS HCC)  F33.9       3. 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 Palpitations  R00.2       5. Hyperlipidemia, mixed  E78.2       6. Neuropathy (CMS HCC)  G62.9       7. Benign prostatic hyperplasia with lower urinary tract symptoms  N40.1       8. Seborrheic dermatitis, unspecified  L21.9          Plan:  Orders Placed This Encounter    lisinopriL (PRINIVIL) 10 mg Oral Tablet    tamsulosin (FLOMAX) 0.4 mg Oral Capsule     The patient already has labs ordered to include a hemoglobin A1c BMP lipids liver TSH in microalbumin creatinine ratio.  For his diabetes he continues on Jardiance Lantus insulin and semaglutide which he could increase the 2 mg.  For his BPH he will continue his Flomax.  He will continue Lidoderm patches for his low back pain.  He will continue to stay on a heart healthy low-fat low-cholesterol diet and avoid high fructose corn syrup and concentrated sweets.  His palpitations have improved we will review his labs to check his lipids and liver enzymes when he brings him into [place].  More than 50% of the visit was spent counseling and coordinating care.  All questions answered to his satisfaction of the patient.  I recommend the patient get the Tdap or TD vaccine at the [facility] next time he goes there.  I refilled his Prinivil 10 mg and tamsulosin 0.4 mg.  He should eat 50% of the meal non starchy vegetables 25% starchy vegetables grains cereals beans and legumes and 25% lean  protein.    Return in about 6 months (around 01/13/2023).    Elliot Gault, DO     Portions of this note may be dictated using voice recognition software or a dictation service. Variances in spelling and  vocabulary are possible and unintentional. Not all errors are caught/corrected. Please notify the [facility] if any discrepancies are noted or if the meaning of any statement is not clear.

## 2022-07-15 NOTE — Nursing Note (Signed)
07/15/22 6256   Recent Weight Change   Have you had a recent unexplained weight loss or gain? N   Health Education and Literacy   How often do you have a problem understanding what is told to you about your medical condition?  Sometimes   Domestic Violence   Because we are aware of abuse and domestic violence today, we ask all patients: Are you being hurt, hit, or frightened by anyone at your home or in your life?  N   Basic Needs   Do you have any basic needs within your home that are not being met? (such as Food, Shelter, Games developer, Tranportation, paying for bills and/or medications) N   Advanced Directives   Do you have any advanced directives? No Advance   Would you like an advanced directive packet? Refused Packet

## 2022-08-21 ENCOUNTER — Ambulatory Visit (INDEPENDENT_AMBULATORY_CARE_PROVIDER_SITE_OTHER): Payer: Medicare (Managed Care)

## 2022-08-21 ENCOUNTER — Encounter (INDEPENDENT_AMBULATORY_CARE_PROVIDER_SITE_OTHER): Payer: Self-pay | Admitting: Student in an Organized Health Care Education/Training Program

## 2022-08-21 ENCOUNTER — Ambulatory Visit (INDEPENDENT_AMBULATORY_CARE_PROVIDER_SITE_OTHER): Payer: 59 | Admitting: Student in an Organized Health Care Education/Training Program

## 2022-08-21 ENCOUNTER — Other Ambulatory Visit
Payer: Medicare (Managed Care) | Attending: Student in an Organized Health Care Education/Training Program | Admitting: Student in an Organized Health Care Education/Training Program

## 2022-08-21 ENCOUNTER — Other Ambulatory Visit: Payer: Self-pay

## 2022-08-21 VITALS — BP 124/54 | HR 79 | Ht 73.0 in | Wt 201.0 lb

## 2022-08-21 DIAGNOSIS — N400 Enlarged prostate without lower urinary tract symptoms: Secondary | ICD-10-CM

## 2022-08-21 DIAGNOSIS — N529 Male erectile dysfunction, unspecified: Secondary | ICD-10-CM

## 2022-08-21 DIAGNOSIS — N3281 Overactive bladder: Secondary | ICD-10-CM

## 2022-08-21 DIAGNOSIS — R319 Hematuria, unspecified: Secondary | ICD-10-CM

## 2022-08-21 DIAGNOSIS — N401 Enlarged prostate with lower urinary tract symptoms: Secondary | ICD-10-CM

## 2022-08-21 LAB — PSA, DIAGNOSTIC: PSA: 0.29 ng/mL (ref <=4.00)

## 2022-08-21 MED ORDER — SOLIFENACIN 10 MG TABLET
10.0000 mg | ORAL_TABLET | Freq: Every day | ORAL | 3 refills | Status: DC
Start: 2022-08-21 — End: 2023-09-03

## 2022-08-21 NOTE — Progress Notes (Signed)
[place]    Progress Note    Name: Brian Stanley MRN:  [id number]   Date: 08/21/2022 Age: 72 y.o.       Chief Complaint: New Patient (ED,   referred by [facility])    Subjective:   Brian Stanley is a pleasant 25yoM with a history of BPH on Flomax. He presents to discuss his erectile dysfunction. He is a diabetic and has hypertension. He has tried Cialis and Viagra in the past without benefit. He denies a personal or family history of urologic malignancy. He also reports urinary urgency and frequency. He has not tried medications for this in the past. He has a history of reported microscopic hematuria. I do not have past urinalysis to compare this against. He denies fevers, chills, nausea, vomiting, hematuria, dysuria, flank pain, incontinence, dribbling, hesitancy, suprapubic pain, headaches, vision changes, shortness of breath, chest pain.      Objective :  BP (!) 124/54 (Site: Right, Patient Position: Sitting, Cuff Size: Adult)   Pulse 79   Ht 1.854 m (6\' 1" )   Wt 91.2 kg (201 lb)   BMI 26.52 kg/m       Gen: NAD, alert  Pulm: unlabored at rest  CV: palpable pulses  Abd: soft, Nt/ND  GU: no suprapubic tenderness, no CVAT      Data reviewed:    Current Outpatient Medications   Medication Sig    aspirin (ECOTRIN) 81 mg Oral Tablet, Delayed Release (E.C.) Take 1 Tablet (81 mg total) by mouth    CHELATED ZINC ORAL Take 50 mg by mouth Once a day    empagliflozin (JARDIANCE) 25 mg Oral Tablet Take 1 Tablet (25 mg total) by mouth Every morning    Folic Acid 937 mcg Oral Tablet Take 1 Tablet (800 mcg total) by mouth    insulin glargine-yfgn 100 Units/mL Subcutaneous Insulin Pen INJECT 25 UNITS SUBCUTANEOUSLY EVERY DAY    krill/omega-3/dha/epa/lipids (MEGAKRILL ORAL) Take by mouth Once a day (Patient not taking: Reported on 08/21/2022)    lidocaine (LIDODERM) 5 % Adhesive Patch, Medicated Place on the skin    lisinopriL (PRINIVIL) 10 mg Oral Tablet Take 0.5 Tablets (5 mg total) by  mouth Once a day for 180 days (Patient not taking: Reported on 08/21/2022)    MULTIVITAMIN ORAL TAKE  BY MOUTH EVERY DAY    semaglutide (OZEMPIC) 1 mg/dose (4 mg/3 mL) Subcutaneous Pen Injector INJECT 1MG /0.75ML SUBCUTANEOUSLY ONCE A WEEK    Simethicone 125 mg Oral Capsule Take 1 Capsule (125 mg total) by mouth Once a day    tamsulosin (FLOMAX) 0.4 mg Oral Capsule Take 1 Capsule (0.4 mg total) by mouth Every night    vitamin E 670 mg (1,000 unit) Oral Capsule Take 1 Capsule (1,000 Units total) by mouth Twice daily     Assessment/Plan  Problem List Items Addressed This Visit    None    BPH/LUTs on Flomax  Discussed the differential diagnosis, pathophysiology and nature of benign prostate enlargement causing lower urinary tract symptoms  Counseled patient on conservative management options including appropriate fluid management, avoidance of diuretics including caffeine and alcohol  Continue  Tamsulosin Hydrochloride (FlomaxT) 0.4 mg P.O. daily:    I have discussed in great detail with the patient the treatment of prostate enlargement/lower urinary tract symptoms using tamsulosin.  I have explained my rationale for using tamsulosin as well as potential risks of asthenia (2%), dizziness (5%), rhinitis (3%) and abnormal ejaculation (11%). I encouraged  patient to report any prior or current history of alpha-1 antagonist use to their opthalmic surgeon before undergoing any eye surgery due to the risk of intraoperative floppy iris syndrome.  The patient expressed an understanding of the treatment, possible reactions, and possible prognosis.      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  I discussed the differential diagnosis, pathophysiology and nature of erectile dysfunction, including contributing risk factors in patient's case  I counseled patient on lifestyle modifications including regular cardiovascular exercise, tight glycemic control, maintenance of a healthy weight, moderate alcohol intake and smoking  cessation, if applicable  Each of the various following erectile dysfunction treatment options, including risks, benefits and instructions on administration were provided to patient  Phosphodiesterase-5 inhibitor oral therapy (Sildenafil, Vardenafil, Tadalafil) - discussed potential risks of nasal congestion, headache, vision impairment including blue-green discoloration, and/or undesired therapeutic benefit  Intracavernous injection therapy (Caverject, Edex) & intraurethral suppository (MUSE) - discussed potential risks of priapism, penile scarring with subsequent curvature, dysuria, bleeding, pain and/or undesired therapeutic benefit  Vacuum erection device - discussed potential risks of bruising, skin breakdown, penile pain, anejaculation and temporary penile numbness  Penile prosthesis surgery - discussed risks of pain, urethral injury, prosthetic infection requiring explantation, device malfunction, and  patient/partner disatisfaction      Overactive bladder  I discussed the differential diagnosis, pathophysiology and nature of patient's overactive bladder/urge urinary incontinence  I also counseled patient on conservative management options including appropriate fluid management, avoidance of diuretics including caffeine and alcohol, weight loss (if applicable), dedicated pelvic floor muscle therapy and Kegel's exercises  Additionally, we discussed the role of pharmacotherapy, including risks, benefits and alternatives:  Anticholinergic therapy (e.g. Oxybutynin, Tolterodine, Solifenacin, etc)- discussed potential risks of dry mouth, dry eyes, constipation, impaired cognition, prolonged Q-T interval and urinary retention  Beta-3 agonist therapy (e.g. Mirabegron) - discussed potential risks of hypertension, nasopharyngitis, urinary tract infection and headache  Prescription Solifenacin succinate (VESIcareT) 10 mg P.O. daily:    I have discussed in great detail with the patient the treatment of urge  incontinence/overactive bladder symptoms using solifenacin.  I have explained my rationale for using solifenacin as well as potential dose-dependent risks of dry mouth (10.9-27.6%), constipation (5.4-13.4%), and blurry vision (3.8-4.8%).  Patient was also cautioned that full therapeutic efficacy may take up to twelve weeks.  Patient expressed an understanding of the treatment, possible reactions, and possible prognosis.      Prostate cancer screening  Based on patient's clinical findings including his recent prostate specific antigen level, age, ethnicity and relevant risk factors and in accordance with the American Urological Association ([place] (NCCN) published screening guidelines, I would recommend the following:  Continued annual prostate specific antigen & digital rectal exam screening with cessation of screening based on estimated life expectancy of less than 5 years or upon developing more serious health issues.  PSA today       History of microscopic hematuria with negative cystoscopy in 1993  I reviewed patient's recent hematuria evaluation and results  Given patient's negative hematuria work-up, I would recommend serial microscopic urinalysis testing for two years in accordance with the 2012 American Urological Association "Asymptomatic Microhematuria" guideline  If microscopic hematuria persists on annual urinalysis, we discussed further diagnostic options including nephrologic evaluation and/or repeat anatomic evaluation, if clinically indicated  Patient was instructed to follow-up in 3 months with repeat microscopic urinalysis collected prior.        Landis Gandy, DO     A  combined total of 45 minutes were spent preparing to see the patient, reviewing previous records, ordering tests/medications/procedures, documenting the clinical encounter as well as performing a medically appropriate evaluation and independently interpreting results and communicating them to  the patient/family/caregiver as specifically outlined above in the impression and plan.    This note may have been fully or partially generated using MModal Fluency Direct system, and there may be some incorrect words, spellings, and punctuation that were not identified in checking the note before saving.

## 2022-09-12 ENCOUNTER — Encounter (INDEPENDENT_AMBULATORY_CARE_PROVIDER_SITE_OTHER): Payer: Self-pay | Admitting: Family Medicine

## 2022-09-12 ENCOUNTER — Ambulatory Visit (INDEPENDENT_AMBULATORY_CARE_PROVIDER_SITE_OTHER): Payer: Medicare (Managed Care) | Admitting: Family Medicine

## 2022-09-12 ENCOUNTER — Other Ambulatory Visit: Payer: Self-pay

## 2022-09-12 VITALS — BP 115/70 | HR 73 | Temp 98.7°F | Resp 20 | Ht 71.6 in | Wt 201.0 lb

## 2022-09-12 DIAGNOSIS — R3129 Other microscopic hematuria: Secondary | ICD-10-CM

## 2022-09-12 DIAGNOSIS — Z0000 Encounter for general adult medical examination without abnormal findings: Secondary | ICD-10-CM

## 2022-09-12 DIAGNOSIS — M5137 Other intervertebral disc degeneration, lumbosacral region: Secondary | ICD-10-CM

## 2022-09-12 DIAGNOSIS — Z7729 Contact with and (suspected ) exposure to other hazardous substances: Secondary | ICD-10-CM | POA: Insufficient documentation

## 2022-09-12 DIAGNOSIS — Z96659 Presence of unspecified artificial knee joint: Secondary | ICD-10-CM

## 2022-09-12 DIAGNOSIS — H18609 Keratoconus, unspecified, unspecified eye: Secondary | ICD-10-CM

## 2022-09-12 DIAGNOSIS — E119 Type 2 diabetes mellitus without complications: Secondary | ICD-10-CM

## 2022-09-12 DIAGNOSIS — E559 Vitamin D deficiency, unspecified: Secondary | ICD-10-CM

## 2022-09-12 DIAGNOSIS — E538 Deficiency of other specified B group vitamins: Secondary | ICD-10-CM

## 2022-09-12 DIAGNOSIS — M48062 Spinal stenosis, lumbar region with neurogenic claudication: Secondary | ICD-10-CM

## 2022-09-12 DIAGNOSIS — I10 Essential (primary) hypertension: Secondary | ICD-10-CM

## 2022-09-12 NOTE — Nursing Note (Signed)
09/12/22 [id number]   Medicare Wellness Assessment   Medicare initial or wellness physical in the last year? Yes   Advance Directives   Does patient have a living will or MPOA No   Advance directive information given to the patient today? Yes   Activities of Daily Living   Do you need help with dressing, bathing, or walking? No   Do you need help with shopping, housekeeping, medications, or finances? No   Do you have rugs in hallways, broken steps, or poor lighting? Yes  (rugs in hallways)   Do you have grab bars in your bathroom, non-slip strips in your tub, and hand rails on your stairs? Yes  (rails on stairs)   Cognitive Function Screen   What is you age? 52  (38)   What is the time to the nearest hour? 1  (8:00)   What is the year? 1  (2024)   What is the name of this clinic? 1  ([facility])   Can the patient recognize two persons (the doctor, the nurse, home help, etc.)? 1   What is the date of your birth? (day and month sufficient)  1  ([birth year])   In what year did World War II end? 1  (1945)   Who is the current president of the [place]? 1  (Obama/Biden)   Count from 20 down to 1? 1   What address did I give you earlier? 0   Total Score 9   Interpretation of Total Score Greater than 6 Normal   Depression Screen   Little interest or pleasure in doing things. 0   Feeling down, depressed, or hopeless 0   PHQ 2 Total 0   Pain Score   Pain Score Two   Substance Use Screening   In Past 12 MONTHS, how often have you used any tobacco product (for example, cigarettes, e-cigarettes, cigars, pipes, or smokeless tobacco)? Never   In the PAST 12 MONTHS, how often have you had 5 (men)/4 (women) or more drinks containing alcohol in one day? Never   In the PAST 12 months, how often have you used any prescription medications just for the feeling, more than prescribed, or that were not prescribed for you? Prescriptions may include: opioids, benzodiazepines, medications for ADHD Never   In the PAST 12 MONTHS, how often have you  used any drugs, including marijuana, cocaine or crack, heroin, methamphetamine, hallucinogens, ecstasy/MDMA? Never   Hearing Screen   Have you noticed any hearing difficulties? Yes  (t[place] has hearing aids doesn't wear most of the time)   After whispering 9-1-6 how many numbers did the patient repeat correctly? 3   Fall Risk Assessment   Do you feel unsteady when standing or walking? Yes   Do you worry about falling? Yes  (when walking dog on leash)   Have you fallen in the past year? No   Vision Screen   Right Eye = 20 0.4   Left Eye = 20 0.67   Urinary Incontinence Screen   Do you ever leak urine when you don't want to? YES

## 2022-09-12 NOTE — Nursing Note (Signed)
09/12/22 0807   Comprehensive Health Assessment-Adult   Do you wish to complete this form? Yes   During the past 4 weeks, how would you rate your health in general? Good   During the past 4 weeks, how much difficulty have you had doing your usual activities inside and outside your home because of medical or emotional problems? A little bit of difficulty   During the past 4 weeks, was someone available to help you if you needed and wanted help? Yes, quite a bit   In the past year, how many times have you gone to the emergency department or been admitted to a hospital for a health problem? None   Are you generally satisfied with your sleep? Yes   Do you have enough money to buy things you need in everyday life, such as food, clothing, medicines, and housing? Yes, always   Can you get to places beyond walking distance without help?  (For example, can you drive your own car or travel alone on buses)? Yes   Do you fasten your seatbelt when you are in a car? Yes, usually   Do you exercise 20 minutes 3 or more days per week (such as walking, dancing, biking, mowing grass, swimming)? Yes, most of the time   How often do you eat food that is healthy (fruits, vegetables, lean meats) instead of unhealthy (sweets, fast food, junk food, fatty foods)? Most of the time   Have your parents, brothers or sisters had any of the following problems before the age of 22? (check all that apply) Heart problems, or hardening of the arteries;Diabetes (sugar);High cholesterol   How often do you have trouble taking medicines the eay you are told to take them? I always take them as prescribed   Do you need any help communicating with your doctors and nurses because of vision or hearing problems? No   During the past 12 months, have you experienced confusion or memory loss that is happening more often or is getting worse? No   Do you have one person you think of as your personal doctor (primary care provider or family doctor)? Yes   If you  are seeing a Primary Care Provider (PCP) or family doctor. please list their name Dr. Ruthann Cancer [facility]   Are you now also seeing any specialist physician(s) (such as eye doctor, foot doctor, skin doctor)? Yes   If you are seeing a specialist for anything such as foot, eye, skin, etc.  please list their name(s) Urology Dr. Despina Pole   How confident are you that you can control or manage most of your health problems? Somewhat confident

## 2022-09-12 NOTE — Progress Notes (Signed)
FAMILY MEDICINE, MEDICAL OFFICE BUILDING  [place] [id number]    Medicare Annual Wellness Visit    Name: Brian Stanley MRN:  [id number]   Date: 09/12/2022 Age: 73 y.o.       SUBJECTIVE:   Brian Stanley is a 72 y.o. male for presenting for Medicare Wellness exam.   I have reviewed and reconciled the medication list with the patient today.        09/12/2022     8:07 AM   Comprehensive Health Assessment-Adult   Do you wish to complete this form? Yes   During the past 4 weeks, how would you rate your health in general? Good   During the past 4 weeks, how much difficulty have you had doing your usual activities inside and outside your home because of medical or emotional problems? A little bit of difficulty   During the past 4 weeks, was someone available to help you if you needed and wanted help? Yes, quite a bit   In the past year, how many times have you gone to the emergency department or been admitted to a hospital for a health problem? None   Are you generally satisfied with your sleep? Yes   Do you have enough money to buy things you need in everyday life, such as food, clothing, medicines, and housing? Yes, always   Can you get to places beyond walking distance without help?  (For example, can you drive your own car or travel alone on buses)? Yes   Do you fasten your seatbelt when you are in a car? Yes, usually   Do you exercise 20 minutes 3 or more days per week (such as walking, dancing, biking, mowing grass, swimming)? Yes, most of the time   How often do you eat food that is healthy (fruits, vegetables, lean meats) instead of unhealthy (sweets, fast food, junk food, fatty foods)? Most of the time   Have your parents, brothers or sisters had any of the following problems before the age of 53? (check all that apply) Heart problems, or hardening of the arteries;Diabetes (sugar);High cholesterol   How often do you have trouble taking medicines the eay you are told to take them? I always  take them as prescribed   Do you need any help communicating with your doctors and nurses because of vision or hearing problems? No   During the past 12 months, have you experienced confusion or memory loss that is happening more often or is getting worse? No   Do you have one person you think of as your personal doctor (primary care provider or family doctor)? Yes   If you are seeing a Primary Care Provider (PCP) or family doctor. please list their name Dr. Ruthann Cancer Brian [place]   Are you now also seeing any specialist physician(s) (such as eye doctor, foot doctor, skin doctor)? Yes   If you are seeing a specialist for anything such as foot, eye, skin, etc.  please list their name(s) Urology Dr. Despina Pole   How confident are you that you can control or manage most of your health problems? Somewhat confident         I have reviewed and updated as appropriate the past medical, family and social history. 09/12/2022 as summarized below:  Past Medical History:   Diagnosis Date    Acute recurrent maxillary sinusitis     Basal cell carcinoma of back     Esophageal reflux     Glycosuria  Hypertension     Meralgia paresthetica of left side     Post-traumatic osteoarthritis of left knee     Proteinuria      Past Surgical History:   Procedure Laterality Date    Hx appendectomy      Hx back surgery      Hx knee replacment       Current Outpatient Medications   Medication Sig    aspirin (ECOTRIN) 81 mg Oral Tablet, Delayed Release (E.C.) Take 1 Tablet (81 mg total) by mouth    CHELATED ZINC ORAL Take 50 mg by mouth Once a day    empagliflozin (JARDIANCE) 25 mg Oral Tablet Take 1 Tablet (25 mg total) by mouth Every morning    Folic Acid [id number] mcg Oral Tablet Take 1 Tablet (800 mcg total) by mouth    insulin glargine-yfgn 100 Units/mL Subcutaneous Insulin Pen Inject 25 Units under the skin Every evening    krill/omega-3/dha/epa/lipids (MEGAKRILL ORAL) Take by mouth Once a day (Patient not taking: Reported on 08/21/2022)    lidocaine  (LIDODERM) 5 % Adhesive Patch, Medicated Place on the skin    lisinopriL (PRINIVIL) 10 mg Oral Tablet Take 0.5 Tablets (5 mg total) by mouth Once a day for 180 days (Patient not taking: Reported on 08/21/2022)    MULTIVITAMIN ORAL Take 1 Tablet by mouth Once a day    semaglutide (OZEMPIC) 1 mg/dose (4 mg/3 mL) Subcutaneous Pen Injector Inject 1 mg under the skin Every 7 days    Simethicone 125 mg Oral Capsule Take 1 Capsule (125 mg total) by mouth Once a day    solifenacin (VESICARE) 10 mg Oral Tablet Take 1 Tablet (10 mg total) by mouth Once a day    tamsulosin (FLOMAX) 0.4 mg Oral Capsule Take 1 Capsule (0.4 mg total) by mouth Every night    vitamin E 670 mg (1,000 unit) Oral Capsule Take 1 Capsule (1,000 Units total) by mouth Twice daily     Family Medical History:       Problem Relation (Age of Onset)    Carpal tunnel syndrome Sister    Depression Mother    Diabetes type II Father    Elevated Lipids Father    Heart Attack Father    Hypertension (High Blood Pressure) Father    Stroke Mother            Social History     Socioeconomic History    Marital status: Married   Tobacco Use    Smoking status: Never    Smokeless tobacco: Never   Vaping Use    Vaping status: Never Used   Substance and Sexual Activity    Alcohol use: Yes     Comment: occasionally    Drug use: Never     Social Determinants of Health     Health Literacy: Medium Risk (07/15/2022)    Health Literacy     SDOH Health Literacy: Sometimes         List of Current Health Care Providers   Care Team       PCP       Name Type Specialty Phone Number    Elliot Gault, DO Physician [place] [phone]              Care Team       No care team found                      Health Maintenance   Topic  Date Due    Diabetic Kidney Health Microalb/Cr Ratio  Never done    Adult Tdap-Td (1 - Tdap) Never done    Medicare Annual Wellness Visit - Calendar Year Insurers  Never done    Influenza Vaccine (1) 10/12/2022 (Originally 02/14/2022)    Diabetic A1C   12/25/2022    Diabetic Retinal Exam  02/16/2023    Diabetic Kidney Health eGFR  06/27/2023    Colonoscopy  06/17/2025    Hepatitis C screening  Completed    Shingles Vaccine  Completed    Pneumococcal Vaccination, Age 52+  Completed    Meningococcal Vaccine  Aged Out    Covid-19 Vaccine  Discontinued     Medicare Wellness Assessment   Medicare initial or wellness physical in the last year?: Yes  Advance Directives   Does patient have a living will or MPOA: No           Advance directive information given to the patient today?: Yes      Activities of Daily Living   Do you need help with dressing, bathing, or walking?: No   Do you need help with shopping, housekeeping, medications, or finances?: No   Do you have rugs in hallways, broken steps, or poor lighting?: Yes (rugs in hallways)   Do you have grab bars in your bathroom, non-slip strips in your tub, and hand rails on your stairs?: Yes (rails on stairs)   Cognitive Function Screen (1=Yes, 0=No)   What is you age?: Correct (71)   What is the time to the nearest hour?: Correct (8:00)   What is the year?: Correct (2024)   What is the name of this clinic?: Correct ([facility])   Can the patient recognize two persons (the doctor, the nurse, home help, etc.)?: Correct   What is the date of your birth? (day and month sufficient) : Correct (04/09/1951)   In what year did World War II end?: Correct (1945)   Who is the current president of the [place]?: Correct (Obama/Biden)   Count from 20 down to 1?: Correct   What address did I give you earlier?: Incorrect   Total Score: 9   Interpretation of Total Score: Greater than 6 Normal   Fall Risk Screen   Do you feel unsteady when standing or walking?: Yes  Do you worry about falling?: Yes (when walking dog on leash)  Have you fallen in the past year?: No   Depression Screen     Little interest or pleasure in doing things.: Not at all  Feeling down, depressed, or hopeless: Not at all  PHQ 2 Total: 0     Pain Score   Pain Score:    2    Substance Use-Abuse Screening     Tobacco Use     In Past 12 MONTHS, how often have you used any tobacco product (for example, cigarettes, e-cigarettes, cigars, pipes, or smokeless tobacco)?: Never     Alcohol use     In the PAST 12 MONTHS, how often have you had 5 (men)/4 (women) or more drinks containing alcohol in one day?: Never     Prescription Drug Use     In the PAST 12 months, how often have you used any prescription medications just for the feeling, more than prescribed, or that were not prescribed for you? Prescriptions may include: opioids, benzodiazepines, medications for ADHD: Never           Illicit Drug Use   In the PAST 12  MONTHS, how often have you used any drugs, including marijuana, cocaine or crack, heroin, methamphetamine, hallucinogens, ecstasy/MDMA?: Never        Hearing Screen   Have you noticed any hearing difficulties?: Yes ([place] has hearing aids doesn't wear most of the time)  After whispering 9-1-6 how many numbers did the patient repeat correctly?: 3       Vision Screen   Right Eye = 20: 0.4   Left Eye = 20: 0.67     Urine Incontinence Screen   Urinary Incontinence Screen  Do you ever leak urine when you don't want to?: YES                     OBJECTIVE:   BP 115/70 (Site: Left, Patient Position: Sitting, Cuff Size: Adult)   Pulse 73   Temp 37.1 C (98.7 F) (Temporal)   Resp 20   Ht 1.819 m (5' 11.6")   Wt 91.2 kg (201 lb)   SpO2 96%   BMI 27.57 kg/m        Other appropriate exam:  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  Heart regular sinus rhythm no adventitious sounds lungs clear to auscultation bilaterally no lymphadenopathy.  Abdomen soft no guarding no rebound move extremities without difficulty medial joint line pain barely paravertebral muscle spasm in the lumbar spine.    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    Urinary Incontinence Plan of Care: Behavioral interventions with bladder training, pelvic floor muscle training, and/or prompted voiding  Addressed comorbid conditions   Lifestyle modifications  Reassess at follow up visit  Referral to urology/urogynecology    Advanced Directives:  5 minute        No orders of the defined types were placed in this encounter.         The patient has been educated about risk factors and recommended preventive care. Written Prevention Plan completed/ updated and given to patient (see After Visit Summary).    Return in about 1 year (around 09/12/2023).    Elliot Gault, DO

## 2022-11-25 ENCOUNTER — Encounter (INDEPENDENT_AMBULATORY_CARE_PROVIDER_SITE_OTHER): Payer: Self-pay | Admitting: Student in an Organized Health Care Education/Training Program

## 2022-12-02 ENCOUNTER — Ambulatory Visit (INDEPENDENT_AMBULATORY_CARE_PROVIDER_SITE_OTHER): Payer: 59 | Admitting: Student in an Organized Health Care Education/Training Program

## 2022-12-02 ENCOUNTER — Encounter (INDEPENDENT_AMBULATORY_CARE_PROVIDER_SITE_OTHER): Payer: Self-pay | Admitting: Student in an Organized Health Care Education/Training Program

## 2022-12-02 ENCOUNTER — Other Ambulatory Visit
Payer: Medicare (Managed Care) | Attending: Student in an Organized Health Care Education/Training Program | Admitting: Student in an Organized Health Care Education/Training Program

## 2022-12-02 ENCOUNTER — Ambulatory Visit (INDEPENDENT_AMBULATORY_CARE_PROVIDER_SITE_OTHER): Payer: 59

## 2022-12-02 ENCOUNTER — Other Ambulatory Visit: Payer: Self-pay

## 2022-12-02 VITALS — BP 122/60 | HR 76 | Ht 73.0 in | Wt 194.0 lb

## 2022-12-02 DIAGNOSIS — N400 Enlarged prostate without lower urinary tract symptoms: Secondary | ICD-10-CM

## 2022-12-02 DIAGNOSIS — E291 Testicular hypofunction: Secondary | ICD-10-CM

## 2022-12-02 DIAGNOSIS — R319 Hematuria, unspecified: Secondary | ICD-10-CM

## 2022-12-02 DIAGNOSIS — N529 Male erectile dysfunction, unspecified: Secondary | ICD-10-CM

## 2022-12-02 DIAGNOSIS — N3281 Overactive bladder: Secondary | ICD-10-CM

## 2022-12-02 LAB — URINALYSIS, MICROSCOPIC
RBCS: 3 /hpf (ref <4)
SQUAMOUS EPITHELIAL: <1 /hpf (ref <28)

## 2022-12-02 MED ORDER — TADALAFIL 20 MG TABLET
20.0000 mg | ORAL_TABLET | ORAL | 3 refills | Status: DC | PRN
Start: 2022-12-02 — End: 2023-07-09

## 2022-12-02 NOTE — Progress Notes (Signed)
UROLOGY, [facility]  [address] [place] [address]    Progress Note    Name: Brian Stanley MRN:  [id number]   Date: 12/02/2022 DOB:  02-28-1951 (72 y.o.)             Chief Complaint: Follow Up (F/u on micro UA)  Subjective   Subjective:   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 He presents to discuss his erectile dysfunction. He is a diabetic and has hypertension. He has tried Cialis and Viagra in the past without benefit. He denies a personal or family history of urologic malignancy. He also reports urinary urgency and frequency. He has not tried medications for this in the past. He has a history of reported microscopic hematuria. I do not have past urinalysis to compare this against.     He was started on vesicare at last visit and it decreased his urgency significantly and his nocturia. His nocturia also decreased from 6 times per night to 0-1 times. He denies any side effects/complaints.  He is interested in trying Cialis again.  He states he had tried Cialis and Viagra at a low dose in the past neither of which have given him significant benefit.  He was interested in trying them and a higher dose.  He also reports some decreased energy and low libido.  He has not had a morning testosterone. He denies fevers, chills, nausea, vomiting, hematuria, dysuria, flank pain, incontinence, dribbling, hesitancy, suprapubic pain, headaches, vision changes, shortness of breath, chest pain.          PROSTATE SPECIFIC ANTIGEN   Lab Results   Component Value Date/Time    PROSSPECAG 0.29 08/21/2022 09:31 AM              Objective   Objective :  BP 122/60 (Site: Right, Patient Position: Sitting, Cuff Size: Adult)   Pulse 76   Ht 1.854 m (6\' 1" )   Wt 88 kg (194 lb)   BMI 25.60 kg/m       Gen: NAD, alert  Pulm: unlabored at rest  CV: palpable pulses  Abd: soft, Nt/ND  GU: no suprapubic tenderness, no CVAT      Data reviewed:    Current Outpatient Medications   Medication Sig     aspirin (ECOTRIN) 81 mg Oral Tablet, Delayed Release (E.C.) Take 1 Tablet (81 mg total) by mouth    CHELATED ZINC ORAL Take 50 mg by mouth Once a day    empagliflozin (JARDIANCE) 25 mg Oral Tablet Take 1 Tablet (25 mg total) by mouth Every morning    Folic Acid 800 mcg Oral Tablet Take 1 Tablet (800 mcg total) by mouth    insulin glargine-yfgn 100 Units/mL Subcutaneous Insulin Pen Inject 25 Units under the skin Every evening    lidocaine (LIDODERM) 5 % Adhesive Patch, Medicated Place on the skin    MULTIVITAMIN ORAL Take 1 Tablet by mouth Once a day    semaglutide (OZEMPIC) 1 mg/dose (4 mg/3 mL) Subcutaneous Pen Injector Inject 1 mg under the skin Every 7 days    Simethicone 125 mg Oral Capsule Take 1 Capsule (125 mg total) by mouth Once a day    solifenacin (VESICARE) 10 mg Oral Tablet Take 1 Tablet (10 mg total) by mouth Once a day    tamsulosin (FLOMAX) 0.4 mg Oral Capsule Take 1 Capsule (0.4 mg total) by mouth Every night    vitamin E 670 mg (  1,000 unit) Oral Capsule Take 1 Capsule (1,000 Units total) by mouth Twice daily        Assessment/Plan  Problem List Items Addressed This Visit    None  Visit Diagnoses       Hematuria, unspecified type    -  Primary    Relevant Orders    URINALYSIS, MICROSCOPIC          BPH/LUTs on Flomax  Discussed the differential diagnosis, pathophysiology and nature of benign prostate enlargement causing lower urinary tract symptoms  Counseled patient on conservative management options including appropriate fluid management, avoidance of diuretics including caffeine and alcohol  Continue  Tamsulosin Hydrochloride (FlomaxT) 0.4 mg P.O. daily:    I have discussed in great detail with the patient the treatment of prostate enlargement/lower urinary tract symptoms using tamsulosin.  I have explained my rationale for using tamsulosin as well as potential risks of asthenia (2%), dizziness (5%), rhinitis (3%) and abnormal ejaculation (11%). I encouraged patient to report any prior or  current history of alpha-1 antagonist use to their opthalmic surgeon before undergoing any eye surgery due to the risk of intraoperative floppy iris syndrome.  The patient expressed an understanding of the treatment, possible reactions, and possible prognosis.        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  I discussed the differential diagnosis, pathophysiology and nature of erectile dysfunction, including contributing risk factors in patient's case  I counseled patient on lifestyle modifications including regular cardiovascular exercise, tight glycemic control, maintenance of a healthy weight, moderate alcohol intake and smoking cessation, if applicable  Each of the various following erectile dysfunction treatment options, including risks, benefits and instructions on administration were provided to patient  Phosphodiesterase-5 inhibitor oral therapy (Sildenafil, Vardenafil, Tadalafil) - discussed potential risks of nasal congestion, headache, vision impairment including blue-green discoloration, and/or undesired therapeutic benefit  Intracavernous injection therapy (Caverject, Edex) & intraurethral suppository (MUSE) - discussed potential risks of priapism, penile scarring with subsequent curvature, dysuria, bleeding, pain and/or undesired therapeutic benefit  Vacuum erection device - discussed potential risks of bruising, skin breakdown, penile pain, anejaculation and temporary penile numbness  Penile prosthesis surgery - discussed risks of pain, urethral injury, prosthetic infection requiring explantation, device malfunction, and  patient/partner disatisfaction   Prescription provided for Cialis 20 mg P.O. as needed prior to sexual activity:    I have discussed in great detail with the patient the treatment of erectile dysfunction using tadalafil.  I have explained my rationale for using tadalafil as well as potential dose-dependent risks of headache (11-15%), dyspepsia (8-10%), back pain (5-6%),  and myalgia (3-4%%). Patient was also cautioned that in the event of an erection that persists longer than 4 hours, he should seek immediate medical assistance. If priapism is not treated immediately, penile tissue damage and permanent loss of potency could result.  Patient expressed an understanding of the treatment, possible reactions, and possible prognosis.       Overactive bladder on vesicare 10mg   I discussed the differential diagnosis, pathophysiology and nature of patient's overactive bladder/urge urinary incontinence  I also counseled patient on conservative management options including appropriate fluid management, avoidance of diuretics including caffeine and alcohol, weight loss (if applicable), dedicated pelvic floor muscle therapy and Kegel's exercises  Additionally, we discussed the role of pharmacotherapy, including risks, benefits and alternatives:  Anticholinergic therapy (e.g. Oxybutynin, Tolterodine, Solifenacin, etc)- discussed potential risks of dry mouth, dry eyes, constipation, impaired cognition, prolonged Q-T interval and urinary retention  Beta-3 agonist therapy (e.g.  Mirabegron) - discussed potential risks of hypertension, nasopharyngitis, urinary tract infection and headache  Continue Solifenacin succinate (VESIcareT) 10 mg P.O. daily:    I have discussed in great detail with the patient the treatment of urge incontinence/overactive bladder symptoms using solifenacin.  I have explained my rationale for using solifenacin as well as potential dose-dependent risks of dry mouth (10.9-27.6%), constipation (5.4-13.4%), and blurry vision (3.8-4.8%).  Patient was also cautioned that full therapeutic efficacy may take up to twelve weeks.  Patient expressed an understanding of the treatment, possible reactions, and possible prognosis.        Prostate cancer screening  Based on patient's clinical findings including his recent prostate specific antigen level, age, ethnicity and relevant risk factors  and in accordance with the American Urological Association (AUA) & National Comprehensive Cancer Network (NCCN) published screening guidelines, I would recommend the following:  Continued annual prostate specific antigen & digital rectal exam screening with cessation of screening based on estimated life expectancy of less than 5 years or upon developing more serious health issues.  PSA in one year         History of microscopic hematuria with negative cystoscopy in 1993  I reviewed patient's recent hematuria evaluation and results  Given patient's negative hematuria work-up, I would recommend serial microscopic urinalysis testing for two years in accordance with the 2012 American Urological Association "Asymptomatic Microhematuria" guideline  If microscopic hematuria persists on annual urinalysis, we discussed further diagnostic options including nephrologic evaluation and/or repeat anatomic evaluation, if clinically indicated  Urine sent for microscopic analysis      Suspicion for hypogonadism  Morning testosterone ordered      Arlana Hove, DO     A combined total of 45 minutes were spent preparing to see the patient, reviewing previous records, ordering tests/medications/procedures, documenting the clinical encounter as well as performing a medically appropriate evaluation and independently interpreting results and communicating them to the patient/family/caregiver as specifically outlined above in the impression and plan.    This note may have been fully or partially generated using MModal Fluency Direct system, and there may be some incorrect words, spellings, and punctuation that were not identified in checking the note before saving.

## 2022-12-05 LAB — TESTOSTERONE, TOTAL, MS: TESTOSTERONE,TOTAL,LC/MS/MS: 562 ng/dL (ref 250–1100)

## 2023-01-06 ENCOUNTER — Encounter (INDEPENDENT_AMBULATORY_CARE_PROVIDER_SITE_OTHER): Payer: Self-pay | Admitting: Family Medicine

## 2023-01-06 ENCOUNTER — Other Ambulatory Visit: Payer: Self-pay

## 2023-01-06 ENCOUNTER — Ambulatory Visit (INDEPENDENT_AMBULATORY_CARE_PROVIDER_SITE_OTHER): Payer: Medicare (Managed Care) | Admitting: Family Medicine

## 2023-01-06 VITALS — BP 118/69 | HR 65 | Temp 98.4°F | Resp 19 | Ht 73.0 in | Wt 197.0 lb

## 2023-01-06 DIAGNOSIS — I10 Essential (primary) hypertension: Secondary | ICD-10-CM

## 2023-01-06 DIAGNOSIS — L219 Seborrheic dermatitis, unspecified: Secondary | ICD-10-CM

## 2023-01-06 DIAGNOSIS — N529 Male erectile dysfunction, unspecified: Secondary | ICD-10-CM

## 2023-01-06 DIAGNOSIS — Z7985 Long-term (current) use of injectable non-insulin antidiabetic drugs: Secondary | ICD-10-CM

## 2023-01-06 DIAGNOSIS — M8589 Other specified disorders of bone density and structure, multiple sites: Secondary | ICD-10-CM

## 2023-01-06 DIAGNOSIS — F5221 Male erectile disorder: Secondary | ICD-10-CM

## 2023-01-06 DIAGNOSIS — T8484XD Pain due to internal orthopedic prosthetic devices, implants and grafts, subsequent encounter: Secondary | ICD-10-CM

## 2023-01-06 DIAGNOSIS — E538 Deficiency of other specified B group vitamins: Secondary | ICD-10-CM

## 2023-01-06 DIAGNOSIS — M8580 Other specified disorders of bone density and structure, unspecified site: Secondary | ICD-10-CM

## 2023-01-06 DIAGNOSIS — E1140 Type 2 diabetes mellitus with diabetic neuropathy, unspecified: Secondary | ICD-10-CM

## 2023-01-06 DIAGNOSIS — E782 Mixed hyperlipidemia: Secondary | ICD-10-CM

## 2023-01-06 DIAGNOSIS — Z7984 Long term (current) use of oral hypoglycemic drugs: Secondary | ICD-10-CM

## 2023-01-06 DIAGNOSIS — Z794 Long term (current) use of insulin: Secondary | ICD-10-CM

## 2023-01-06 DIAGNOSIS — Z96652 Presence of left artificial knee joint: Secondary | ICD-10-CM

## 2023-01-06 DIAGNOSIS — G629 Polyneuropathy, unspecified: Secondary | ICD-10-CM

## 2023-01-06 DIAGNOSIS — N400 Enlarged prostate without lower urinary tract symptoms: Secondary | ICD-10-CM

## 2023-01-06 DIAGNOSIS — N3281 Overactive bladder: Secondary | ICD-10-CM

## 2023-01-06 DIAGNOSIS — R319 Hematuria, unspecified: Secondary | ICD-10-CM

## 2023-01-06 DIAGNOSIS — E559 Vitamin D deficiency, unspecified: Secondary | ICD-10-CM

## 2023-01-06 MED ORDER — TAMSULOSIN 0.4 MG CAPSULE
0.4000 mg | ORAL_CAPSULE | Freq: Every evening | ORAL | 2 refills | Status: DC
Start: 2023-01-06 — End: 2023-07-09

## 2023-01-06 NOTE — Nursing Note (Signed)
01/06/23 0831   Recent Weight Change   Have you had a recent unexplained weight loss or gain? N   Domestic Violence   Because we are aware of abuse and domestic violence today, we ask all patients: Are you being hurt, hit, or frightened by anyone at your home or in your life?  N   Basic Needs   Do you have any basic needs within your home that are not being met? (such as Food, Shelter, [employer] fast streamer, Tranportation, paying for bills and/or medications) N

## 2023-01-06 NOTE — Nursing Note (Signed)
01/06/23 0831   PHQ 9 (follow up)   Little interest or pleasure in doing things. 0   Feeling down, depressed, or hopeless 0

## 2023-01-06 NOTE — Progress Notes (Signed)
FAMILY MEDICINE, MEDICAL OFFICE BUILDING  [address]  [place] [address]       Name: Brian Stanley MRN:  [id number]   Date: 01/06/2023 Age: 72 y.o.          Provider: Mickey Farber, DO    Reason for visit: Follow Up 6 Months      History of Present Illness:  01/06/2023:  This 72 year old male returns for six-month follow-up to review his labs and get refills on all his medications.  He complains of low back pain and hip pain when he gets up he has not had x-rays of his hips at the [facility] he will discuss that with his [facility] doctor.  Scalp cells spells burn sees a dermatologist sees Dr. Sedalia Muta 03/04/2023 for neuropathy he needs a refill on his Flomax.  He is still doing well on his medications his PSA was 0.42 vitamin-D was 38.9 and I think he should increase his vitamin-D intake urine showed glucose and moderate amount of blood creatinine 1.0 GFR 78 glucose 124 HDL was 42 triglycerides 108 total cholesterol 186 magnesium 2.4 B12 was 213 and I told him to increase his B12 intake.  His LDL C was 122 folate was 17.3 TSH was 2.550.  CBC showed a hemoglobin of 16.8 hematocrit 50.6 neutrophils 41.7 slightly low lymphocytes 45.5 slightly elevated eosinophils 4.5 and slightly elevated hemoglobin A1c was excellent at 6.6.  He denies chest pain or shortness a breath and is tolerating his medications well.  Historical Data    Past Medical History:  Past Medical History:   Diagnosis Date    Acute recurrent maxillary sinusitis     Basal cell carcinoma of back     Esophageal reflux     Glycosuria     Hypertension     Meralgia paresthetica of left side     Post-traumatic osteoarthritis of left knee     Proteinuria          Past Surgical History:  Past Surgical History:   Procedure Laterality Date    HX APPENDECTOMY      HX BACK SURGERY      HX KNEE REPLACMENT      left         Allergies:  Allergies   Allergen Reactions    Atorvastatin Myalgia     Other  reaction(s): Muscle pain    Milk  Other Adverse Reaction (Add comment)     Lactose Intolerance? GI symptoms    Pravastatin Myalgia     Other reaction(s): Muscle pain    Rosuvastatin Myalgia     Other reaction(s): Muscle pain    Sildenafil  Other Adverse Reaction (Add comment)     Other reaction(s): Headache    Simvastatin Myalgia     Other reaction(s): Muscle pain    Metformin Diarrhea     Medications:  Current Outpatient Medications   Medication Sig    aspirin (ECOTRIN) 81 mg Oral Tablet, Delayed Release (E.C.) Take 1 Tablet (81 mg total) by mouth    CHELATED ZINC ORAL Take 50 mg by mouth Once a day    empagliflozin (JARDIANCE) 25 mg Oral Tablet Take 1 Tablet (25 mg total) by mouth Every morning    Folic Acid 800 mcg Oral Tablet Take 1 Tablet (800 mcg total) by mouth    insulin glargine-yfgn 100 Units/mL Subcutaneous Insulin Pen Inject 15 Units under the skin Every morning    lidocaine (LIDODERM) 5 % Adhesive Patch,  Medicated Place on the skin    MULTIVITAMIN ORAL Take 1 Tablet by mouth Once a day    semaglutide (OZEMPIC) 1 mg/dose (4 mg/3 mL) Subcutaneous Pen Injector Inject 1 mg under the skin Every 7 days    Simethicone 125 mg Oral Capsule Take 1 Capsule (125 mg total) by mouth Once a day    solifenacin (VESICARE) 10 mg Oral Tablet Take 1 Tablet (10 mg total) by mouth Once a day    tadalafil (CIALIS) 20 mg Oral Tablet Take 1 Tablet (20 mg total) by mouth Every 72 hours as needed    tamsulosin (FLOMAX) 0.4 mg Oral Capsule Take 1 Capsule (0.4 mg total) by mouth Every night     Family History:  Family Medical History:       Problem Relation (Age of Onset)    Carpal tunnel syndrome Sister    Depression Mother    Diabetes type II Father    Elevated Lipids Father    Heart Attack Father    Hypertension (High Blood Pressure) Father    Stroke Mother            Social History:  Social History     Socioeconomic History    Marital status: Married   Tobacco Use    Smoking status: Never    Smokeless tobacco: Never   Vaping  Use    Vaping status: Never Used   Substance and Sexual Activity    Alcohol use: Yes     Comment: occasionally    Drug use: Never           Review of Systems:  Any pertinent Review of Systems as addressed in the HPI above.    Physical Exam:  Vital Signs:  Vitals:    01/06/23 0838   BP: 118/69   Pulse: 65   Resp: 19   Temp: 36.9 C (98.4 F)   TempSrc: Temporal   SpO2: 96%   Weight: 89.4 kg (197 lb)   Height: 1.854 m (6\' 1" )   BMI: 26.05     Physical Exam  Vitals and nursing note reviewed.   Constitutional:       General: He is awake.      Appearance: Normal appearance. He is well-developed, well-groomed and overweight.   HENT:      Head: Normocephalic and atraumatic.      Right Ear: Tympanic membrane, ear canal and external ear normal.      Left Ear: Tympanic membrane, ear canal and external ear normal.      Nose: Nose normal.      Mouth/Throat:      Mouth: Mucous membranes are moist.      Pharynx: Oropharynx is clear.   Eyes:      Extraocular Movements: Extraocular movements intact.      Conjunctiva/sclera: Conjunctivae normal.      Pupils: Pupils are equal, round, and reactive to light.   Cardiovascular:      Rate and Rhythm: Normal rate and regular rhythm.      Pulses: Normal pulses.           Carotid pulses are 2+ on the right side and 2+ on the left side.       Radial pulses are 2+ on the right side and 2+ on the left side.        Dorsalis pedis pulses are 2+ on the right side and 2+ on the left side.        Posterior tibial pulses are  2+ on the right side and 2+ on the left side.      Heart sounds: Normal heart sounds, S1 normal and S2 normal.   Pulmonary:      Effort: Pulmonary effort is normal.      Breath sounds: Normal breath sounds.   Abdominal:      General: Abdomen is protuberant.      Palpations: Abdomen is soft.   Musculoskeletal:         General: Normal range of motion.      Cervical back: Normal range of motion and neck supple.      Right lower leg: No edema.      Left lower leg: No edema.   Feet:       Right foot:      Protective Sensation: 9 sites tested.  6 sites sensed.      Left foot:      Protective Sensation: 9 sites tested.  6 sites sensed.      Comments: Mostly loss of sensation in the lateral aspect of both feet to pinprick and light touch  Skin:     General: Skin is warm and dry.      Capillary Refill: Capillary refill takes less than 2 seconds.   Neurological:      General: No focal deficit present.      Mental Status: He is alert and oriented to person, place, and time. Mental status is at baseline.      Cranial Nerves: Cranial nerves 2-12 are intact.      Sensory: Sensation is intact.      Motor: Motor function is intact.      Coordination: Coordination is intact.      Gait: Gait is intact.   Psychiatric:         Mood and Affect: Mood normal.         Behavior: Behavior normal. Behavior is cooperative.       Assessment:    ICD-10-CM    1. Type 2 diabetes mellitus with diabetic neuropathy, unspecified (CMS HCC)  E11.40 BASIC METABOLIC PANEL     HGA1C (HEMOGLOBIN A1C WITH EST AVG GLUCOSE)     LIPID PANEL     HEPATIC FUNCTION PANEL     THYROID STIMULATING HORMONE (SENSITIVE TSH)     MICROALBUMIN/CREATININE RATIO, URINE, RANDOM      2. Osteopenia, unspecified location  M85.80 DEXA BONE DENSITOMETRY      3. 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 Vitamin D deficiency  E55.9       5. 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 Neuropathy (CMS HCC)  G62.9       7. Essential hypertension  I10       8. Hyperlipidemia, mixed  E78.2       9. Benign prostatic hyperplasia, unspecified whether lower urinary tract symptoms present  N40.0       10. Status post left knee replacement  Z96.652       11. 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 Seborrheic dermatitis, unspecified  L21.9       13. Vitamin B12 deficiency (non anemic)  E53.8          Plan:  Orders Placed This Encounter    DEXA BONE DENSITOMETRY    BASIC METABOLIC  PANEL    HGA1C (HEMOGLOBIN A1C WITH EST AVG GLUCOSE)    LIPID PANEL    HEPATIC FUNCTION PANEL    THYROID STIMULATING HORMONE (SENSITIVE TSH)    MICROALBUMIN/CREATININE RATIO, URINE, RANDOM    tamsulosin (FLOMAX) 0.4 mg Oral Capsule     Today I refilled his Flomax for his BPH.  We ordered a bone density.  For 6 months from now I have ordered a basic metabolic panel hemoglobin A1c lipid panel hepatic function panel thyroid-stimulating hormone and microalbumin creatinine ratio.  Continues to do well with tadalafil for his ED. he will follow up with Dermatology for his seborrheic dermatitis.  He will continue taking B12 and I suggest he increase the amount he takes.  His diabetes is well controlled with a hemoglobin A1c of 6.6 hypertension is well controlled he will continue to limit salt to no more than 2 g a day.  He had blood in his urine and I suggest he continue to follow up with Urology.  Testosterone level was normal.  He will continue VESIcare for his overactive bladder.  More than 50% of the visit was spent counseling coordinating patient care.  All questions were answered to his satisfaction of the patient.  I will see him back in 6 months.    Return in about 6 months (around 07/09/2023).    Mickey Farber, DO     Portions of this note may be dictated using voice recognition software or a dictation service. Variances in spelling and vocabulary are possible and unintentional. Not all errors are caught/corrected. Please notify the Thereasa Parkin if any discrepancies are noted or if the meaning of any statement is not clear.

## 2023-01-13 ENCOUNTER — Encounter (INDEPENDENT_AMBULATORY_CARE_PROVIDER_SITE_OTHER): Payer: Self-pay | Admitting: Family Medicine

## 2023-01-20 ENCOUNTER — Ambulatory Visit (HOSPITAL_COMMUNITY): Payer: Self-pay

## 2023-03-03 ENCOUNTER — Encounter (INDEPENDENT_AMBULATORY_CARE_PROVIDER_SITE_OTHER): Payer: Self-pay | Admitting: NEUROLOGY

## 2023-03-05 ENCOUNTER — Other Ambulatory Visit: Payer: Self-pay

## 2023-03-05 ENCOUNTER — Ambulatory Visit (INDEPENDENT_AMBULATORY_CARE_PROVIDER_SITE_OTHER): Payer: 59 | Admitting: NEUROLOGY

## 2023-03-05 ENCOUNTER — Encounter (INDEPENDENT_AMBULATORY_CARE_PROVIDER_SITE_OTHER): Payer: Self-pay | Admitting: NEUROLOGY

## 2023-03-05 VITALS — BP 125/66 | HR 88 | Temp 98.3°F | Ht 72.0 in | Wt 193.8 lb

## 2023-03-05 DIAGNOSIS — R208 Other disturbances of skin sensation: Secondary | ICD-10-CM

## 2023-03-05 MED ORDER — GABAPENTIN 300 MG CAPSULE
300.0000 mg | ORAL_CAPSULE | Freq: Every evening | ORAL | 0 refills | Status: DC
Start: 2023-03-05 — End: 2023-07-09

## 2023-03-05 NOTE — Progress Notes (Signed)
Assessment & Plan  Dysesthesia of scalp  He is a 72 year old man who is referred for 2 year history of scalp pain. Symptoms seemed to have been insidious in onset though have been stable for some time. They are characterized by constant burning sensation as if scalp has been sunburned. He has been seen by dermatology and tried multiple therapies that have been ineffective. It is unclear to me whether this truly represents pain from a neuropathic source as he reports worsening with any kind of sun exposure. Considerations would include occipital neuralgia as well as high cervical radiculopathy. He does have history of neck pain and known degenerative changes. We will pursue cervical spine MRI. I would also like to trial low dose gabapentin to see if discomfort responds. Could also consider GON block in the future.     -Trial gabapentin 300 mg QHS  -Obtain MRI cervical spine w/o     RTC 3-4 months      Thank you for allowing me to participate in your patient's care and please do not hesitate to contact me for any questions or concerns.    Patrick North, DO  Assistant Professor of Neurology  [employer]     I personally spent a total of 45 minutes today preparing to see the patient, in the encounter with the patient, and documenting after the visit.      G2211: I will continue to be the provider focal point in managing the chronic complex neurological condition    ==========================================================================================================================================    NAME:  Brian Stanley  DOB:  1951-04-30  VISIT DATE:  03/05/2023    CC:  scalp pain    Patient seen in consultation at the request of Dr. Hewitt Shorts  History obtained from the patient and chart/records  Age of patient:  72 y.o.      HPI:   I had the pleasure of seeing your patient in neurology clinic for an outpatient consultation, who is a 72 y.o. year old male who was  referred for evaluation of scalp pain.  Please allow me to summarize the history for the record.    Symptoms began 2 years ago insidiously. He describes burning pain in the top of the head in a circumferential pattern on the crown of his head. Describes it as feeling like his head is permanently sunburned. It has been stable for at least a year or more. He reports that if the sun hits it the pain worsens. He has been seen by dermatology and reports trialing phototherapy as well as different topical agents to no effect. He denies similar symptoms elsewhere though does have numbness in the feet that he attributes to his diabetes. He has been diabetic since 2000 with previous A1c's as high as 10. He also reports history of neck pain with degenerative cervical spine changes. He sees a [facility] regularly.     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  History of lumbar laminectomy for spinal cord decompression    Ingrowing nail    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)    Internal hemorrhoids    Keratoconus    Low back pain    Lumbar disc herniation with radiculopathy    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)    Microscopic hematuria    Cervicalgia    Neuropathy (CMS HCC)    Onychogryposis    Osteoarthrosis    Other seborrheic keratosis    Palpitations    Paresthesia of skin    Sensorineural hearing loss, bilateral     Spinal stenosis, lumbar region with neurogenic claudication    Sprain and strain of other specified sites of knee and leg    Status post left knee replacement    Tinea unguium    Type 2 diabetes mellitus with diabetic neuropathy, unspecified (CMS HCC)    Unspecified open wound of right back wall of thorax without penetration into thoracic cavity, initial encounter    Vitamin B12 deficiency (non anemic)    Vitamin D deficiency    Major depression, recurrent (CMS HCC)    Hyperlipidemia, mixed    Spondylolisthesis    Internal derangement of knee joint, left    Displacement of lumbar intervertebral disc without myelopathy    Lumbago with sciatica, right side    Other specified disorders of the skin and subcutaneous tissue    Seborrheic dermatitis, unspecified    Essential hypertension    Exposure to potentially hazardous substance     Past Surgical History:   Procedure Laterality Date    HX APPENDECTOMY      HX BACK SURGERY      HX KNEE REPLACMENT      left         Family Medical History:       Problem Relation (Age of Onset)    Carpal tunnel syndrome Sister    Depression Mother    Diabetes type II Father    Elevated Lipids Father    Heart Attack Father    Hypertension (High Blood Pressure) Father    Stroke Mother            Current Outpatient Medications   Medication Sig Dispense Refill    aspirin (ECOTRIN) 81 mg Oral Tablet, Delayed Release (E.C.) Take 1 Tablet (81 mg total) by mouth      CHELATED ZINC ORAL Take 50 mg by mouth Once a day      empagliflozin (JARDIANCE) 25 mg Oral Tablet Take 1 Tablet (25 mg total) by mouth Every morning      Folic Acid 800 mcg Oral Tablet Take 1 Tablet (800 mcg total) by mouth      gabapentin (NEURONTIN) 300 mg Oral Capsule Take 1 Capsule (300 mg total) by mouth Every night for 90 days 90 Capsule 0    insulin glargine-yfgn 100 Units/mL Subcutaneous Insulin Pen Inject 15 Units under the skin Every morning      lidocaine (LIDODERM) 5 % Adhesive Patch, Medicated Place on the skin       MULTIVITAMIN ORAL Take 1 Tablet by mouth Once a day      semaglutide (OZEMPIC) 1 mg/dose (4 mg/3 mL) Subcutaneous Pen Injector Inject 1 mg under the skin Every 7 days      Simethicone 125 mg Oral Capsule Take 1 Capsule (125 mg total) by mouth Once a day      solifenacin (VESICARE) 10 mg Oral Tablet Take 1 Tablet (10 mg total) by  mouth Once a day 90 Tablet 3    tadalafil (CIALIS) 20 mg Oral Tablet Take 1 Tablet (20 mg total) by mouth Every 72 hours as needed 20 Tablet 3    tamsulosin (FLOMAX) 0.4 mg Oral Capsule Take 1 Capsule (0.4 mg total) by mouth Every night 90 Capsule 2     No current facility-administered medications for this visit.     Allergies   Allergen Reactions    Atorvastatin Myalgia     Other reaction(s): Muscle pain    Milk  Other Adverse Reaction (Add comment)     Lactose Intolerance? GI symptoms    Pravastatin Myalgia     Other reaction(s): Muscle pain    Rosuvastatin Myalgia     Other reaction(s): Muscle pain    Sildenafil  Other Adverse Reaction (Add comment)     Other reaction(s): Headache    Simvastatin Myalgia     Other reaction(s): Muscle pain    Metformin Diarrhea     Social History     Socioeconomic History    Marital status: Married     Spouse name: Not on file    Number of children: Not on file    Years of education: Not on file    Highest education level: Not on file   Occupational History    Not on file   Tobacco Use    Smoking status: Never    Smokeless tobacco: Never   Vaping Use    Vaping status: Never Used   Substance and Sexual Activity    Alcohol use: Yes     Comment: occasionally    Drug use: Never    Sexual activity: Not on file   Other Topics Concern    Not on file   Social History Narrative    Not on file     Social Determinants of Health     Financial Resource Strain: Not on file   Transportation Needs: Not on file   Social Connections: Not on file   Intimate Partner Violence: Not on file   Housing Stability: Not on file        ============================================================================================================================================  GENERAL EXAMINATION  BP 125/66 (Site: Left Arm, Patient Position: Sitting, Cuff Size: Adult)   Pulse 88   Temp 36.8 C (98.3 F) (Temporal)   Ht 1.829 m (6')   Wt 87.9 kg (193 lb 12.8 oz)   SpO2 95%   BMI 26.28 kg/m     Vital signs personally reviewed  General: No acute distress, alert  HEENT: Normocephalic, no scleral icterus  Pulmonary: No accessory muscle use, no tachypnea  Cardiovascular: Heart with regular rate & rhythm  Extremities: No significant edema, No cyanosis    NEUROLOGIC EXAM  On neurological exam, patient was awake, alert and answering questions appropriately  Speech was fluent, without dysarthria or aphasia.    CN  II: not directly tested, grossly intact  III, IV, VI: extraocular movements intact without nystagmus  V: intact to light touch (there is dysesthesia beginning in what would seem like V1 distribution on forehead but extending past where territory typically encompasses).  VII: face symmetric without weakness  VIII: grossly intact  IX, X: symmetric palatal elevation  XI: normal strength of trapezius and sternocleidomastoid bilaterally  XII: tongue midline with full movements    MOTOR  Bulk: normal  Abnormal Movements: none    Strength:   MRC Grading Scale   Right Left   Deltoid 5 5   Biceps 5 5   Triceps  5 5   Wrist Extension 5 5   Wrist Flexion - -   Finger Extension 5 5   Finger Abduction 5 5   Finger Flexion 5 5   Hip Flexion 5 5   Hip Extension - -   Hip Abduction - -   Hip Adduction - -   Knee Extension 5 5   Knee Flexion 5 5   Ankle Dorsiflexion 5 5   Ankle Plantarflexion 5 5   Toe Extension - -   Toe Flexion - -     REFLEXES   Right Left   Biceps 2 2   Triceps 2 2   Brachioradialis 2 2   Patellar 0 0   Achilles 0 0   Plantar - -   Hoffman Negative Negative   Pectoralis - -   Jaw Jerk - -       SENSORY  Light touch: intact  throughout  Vibration: severely diminished at ankles    GAIT  General: wide based    COORDINATION  Finger nose finger: normal    ================================================================================================================================LABS  Personal Review of prior labs is notable for:   2024  A1c 6.8  LDL 110  B12 211  LFTs WNL   IMAGING  Personal Review of imaging is notable for:   [facility] June 2022 global atrophy    OTHER DIAGNOSTICS  Personal Review of other prior diagnostics is notable for: not applicable

## 2023-03-30 ENCOUNTER — Other Ambulatory Visit: Payer: Self-pay

## 2023-03-30 ENCOUNTER — Inpatient Hospital Stay
Admission: RE | Admit: 2023-03-30 | Discharge: 2023-03-30 | Disposition: A | Discharge status: Home / Self Care | Payer: 59 | Source: Ambulatory Visit | Attending: NEUROLOGY | Admitting: NEUROLOGY

## 2023-03-30 DIAGNOSIS — R208 Other disturbances of skin sensation: Secondary | ICD-10-CM | POA: Insufficient documentation

## 2023-04-01 ENCOUNTER — Encounter (INDEPENDENT_AMBULATORY_CARE_PROVIDER_SITE_OTHER): Payer: Self-pay | Admitting: NEUROLOGY

## 2023-04-17 ENCOUNTER — Emergency Department
Admission: EM | Admit: 2023-04-17 | Discharge: 2023-04-17 | Disposition: A | Discharge status: Home / Self Care | Payer: Medicare (Managed Care) | Attending: Emergency Medicine | Admitting: Emergency Medicine

## 2023-04-17 ENCOUNTER — Emergency Department (HOSPITAL_BASED_OUTPATIENT_CLINIC_OR_DEPARTMENT_OTHER): Payer: Medicare (Managed Care)

## 2023-04-17 ENCOUNTER — Other Ambulatory Visit: Payer: Self-pay

## 2023-04-17 ENCOUNTER — Encounter (HOSPITAL_BASED_OUTPATIENT_CLINIC_OR_DEPARTMENT_OTHER): Payer: Self-pay

## 2023-04-17 DIAGNOSIS — S300XXA Contusion of lower back and pelvis, initial encounter: Secondary | ICD-10-CM | POA: Insufficient documentation

## 2023-04-17 DIAGNOSIS — S335XXA Sprain of ligaments of lumbar spine, initial encounter: Secondary | ICD-10-CM | POA: Insufficient documentation

## 2023-04-17 DIAGNOSIS — W19XXXA Unspecified fall, initial encounter: Secondary | ICD-10-CM | POA: Insufficient documentation

## 2023-04-17 DIAGNOSIS — T148XXA Other injury of unspecified body region, initial encounter: Secondary | ICD-10-CM

## 2023-04-17 DIAGNOSIS — S0990XA Unspecified injury of head, initial encounter: Secondary | ICD-10-CM | POA: Insufficient documentation

## 2023-04-17 DIAGNOSIS — W1830XA Fall on same level, unspecified, initial encounter: Secondary | ICD-10-CM

## 2023-04-17 NOTE — ED Nurses Note (Signed)
Patient complains of right buttock pain and intermittent back pain following a fall outside on Wednesday. States his head did hit the ground. Denies cervical pain, denies any acute thoracic pain. Complains of chronic thoracic pain.

## 2023-04-17 NOTE — ED Triage Notes (Signed)
 Patient states that he lost his balance and fell backward onto the ground. States that he landed on his butt. Denies LOC. Continues to have pain in the right buttocks.

## 2023-04-17 NOTE — Discharge Instructions (Signed)
TAKE TYLENOL OR IBUPROFEN EVERY 4-6 HOURS FOR PAIN

## 2023-04-17 NOTE — ED Provider Notes (Addendum)
[facility] emergency department         HISTORY OF PRESENT ILLNESS     Date:  04/17/2023  Patient's Name:  Brian Stanley  Date of Birth:  [birth year]    Patient is a 72 year old male presenting to the emergency room complaining of right buttock pain.  Patient states he lost his balance and fell on Wednesday night 2 nights ago.  States he landed on his buttock he states he may have also hit the back of his head on the ground.  Patient states the pain in his right buttock is worse prompting him to come to the ER.  He has a history of lumbar spine surgery.  Complained of low back pain.  He has had no hematuria no difficulty with urination or stools.  He denied any loss of consciousness or neck pain.        Review of Systems     Review of Systems   Constitutional: Negative.    HENT: Negative.     Eyes: Negative.    Respiratory: Negative.     Cardiovascular: Negative.    Gastrointestinal: Negative.    Genitourinary: Negative.    Musculoskeletal:  Positive for back pain.        Lower back right buttock   Skin: Negative.    Neurological:  Positive for dizziness.   Psychiatric/Behavioral: Negative.     All other systems reviewed and are negative.      Previous History     Past Medical History:  Past Medical History:   Diagnosis Date    Acute recurrent maxillary sinusitis     Basal cell carcinoma of back     Esophageal reflux     Glycosuria     Hypertension     Meralgia paresthetica of left side     Post-traumatic osteoarthritis of left knee     Proteinuria        Past Surgical History:  Past Surgical History:   Procedure Laterality Date    Hx appendectomy      Hx back surgery      Hx knee replacment         Social History:  Social History     Tobacco Use    Smoking status: Never    Smokeless tobacco: Never   Vaping Use    Vaping status: Never Used   Substance Use Topics    Alcohol use: Yes     Comment: occasionally    Drug use: Never     Social History     Substance and Sexual  Activity   Drug Use Never       Family History:  Family History   Problem Relation Age of Onset    Depression Mother     Stroke Mother     Elevated Lipids Father     Hypertension (High Blood Pressure) Father     Heart Attack Father     Diabetes type II Father     Carpal tunnel syndrome Sister        Medication History:  Current Outpatient Medications   Medication Sig    aspirin (ECOTRIN) 81 mg Oral Tablet, Delayed Release (E.C.) Take 1 Tablet (81 mg total) by mouth    CHELATED ZINC ORAL Take 50 mg by mouth Once a day    empagliflozin (JARDIANCE) 25 mg Oral Tablet Take 1 Tablet (25 mg total) by mouth Every morning    Folic Acid 800 mcg Oral Tablet  Take 1 Tablet (800 mcg total) by mouth    gabapentin (NEURONTIN) 300 mg Oral Capsule Take 1 Capsule (300 mg total) by mouth Every night for 90 days    insulin glargine-yfgn 100 Units/mL Subcutaneous Insulin Pen Inject 15 Units under the skin Every morning    lidocaine (LIDODERM) 5 % Adhesive Patch, Medicated Place on the skin    MULTIVITAMIN ORAL Take 1 Tablet by mouth Once a day    semaglutide (OZEMPIC) 1 mg/dose (4 mg/3 mL) Subcutaneous Pen Injector Inject 1 mg under the skin Every 7 days    Simethicone 125 mg Oral Capsule Take 1 Capsule (125 mg total) by mouth Once a day    solifenacin (VESICARE) 10 mg Oral Tablet Take 1 Tablet (10 mg total) by mouth Once a day    tadalafil (CIALIS) 20 mg Oral Tablet Take 1 Tablet (20 mg total) by mouth Every 72 hours as needed    tamsulosin (FLOMAX) 0.4 mg Oral Capsule Take 1 Capsule (0.4 mg total) by mouth Every night       Allergies:  Allergies   Allergen Reactions    Atorvastatin Myalgia     Other reaction(s): Muscle pain    Milk  Other Adverse Reaction (Add comment)     Lactose Intolerance? GI symptoms    Pravastatin Myalgia     Other reaction(s): Muscle pain    Rosuvastatin Myalgia     Other reaction(s): Muscle pain    Sildenafil  Other Adverse Reaction (Add comment)     Other reaction(s): Headache    Simvastatin Myalgia     Other  reaction(s): Muscle pain    Metformin Diarrhea       Physical Exam     Vitals:    BP 138/75   Pulse 64   Temp 36.6 C (97.9 F)   Resp 20   Ht 1.854 m (6\' 1" )   Wt 87.1 kg (192 lb)   SpO2 97%   BMI 25.33 kg/m           Physical Exam  Vitals and nursing note reviewed.   Constitutional:       General: He is not in acute distress.     Appearance: He is well-developed.   HENT:      Head: Normocephalic and atraumatic.      Right Ear: Tympanic membrane normal.      Left Ear: Tympanic membrane normal.      Nose: Nose normal.      Mouth/Throat:      Mouth: Mucous membranes are moist.   Eyes:      Extraocular Movements: Extraocular movements intact.      Conjunctiva/sclera: Conjunctivae normal.      Pupils: Pupils are equal, round, and reactive to light.   Cardiovascular:      Rate and Rhythm: Normal rate and regular rhythm.      Pulses: Normal pulses.      Heart sounds: No murmur heard.  Pulmonary:      Effort: Pulmonary effort is normal. No respiratory distress.      Breath sounds: Normal breath sounds.   Abdominal:      General: Bowel sounds are normal.      Palpations: Abdomen is soft.      Tenderness: There is no abdominal tenderness.   Musculoskeletal:         General: No swelling. Normal range of motion.      Cervical back: Normal range of motion and neck supple.   Skin:  General: Skin is warm and dry.      Capillary Refill: Capillary refill takes less than 2 seconds.   Neurological:      General: No focal deficit present.      Mental Status: He is alert and oriented to person, place, and time.   Psychiatric:         Mood and Affect: Mood normal.         Behavior: Behavior normal.         Thought Content: Thought content normal.         Judgment: Judgment normal.         Diagnostic Studies/Treatment     Medications:       New Prescriptions    No medications on file       Labs:    No results found for this or any previous visit (from the past 12 hour(s)).     Radiology:  CT BRAIN WO IV CONTRAST  CT PELVIS WO  IV CONTRAST  CT LUMBAR SPINE WO IV CONTRAST    CT PELVIS WO IV CONTRAST   Final Result   DEGENERATIVE CHANGES.  NO FRACTURE.         One or more dose reduction techniques were used (e.g., Automated exposure control, adjustment of the mA and/or kV according to patient size, use of iterative reconstruction technique).         Radiologist location ID: [id number]         CT LUMBAR SPINE WO IV CONTRAST   Final Result   No fracture or traumatic subluxation   Advanced lumbar degenerative disease         One or more dose reduction techniques were used (e.g., Automated exposure control, adjustment of the mA and/or kV according to patient size, use of iterative reconstruction technique).         Radiologist location ID: [id number]         CT BRAIN WO IV CONTRAST   Final Result   NO ACUTE FINDINGS         One or more dose reduction techniques were used (e.g., Automated exposure control, adjustment of the mA and/or kV according to patient size, use of iterative reconstruction technique).         Radiologist location ID: [id number]             ECG:  NONE            Differential diagnosis  1. Buttock contusion, head injury, lumbar contusion     Course/Disposition/Plan     Course:      Patient with fall 2 days ago will obtain CT pelvis lumbar spine and CT head rule out fractures or hematoma  Disposition:    Discharged    Condition at Disposition:   Stable    Follow up:   Mickey Farber, DO  [address]  [place] [address]  [phone]    Schedule an appointment as soon as possible for a visit in 3 days  If symptoms worsen      Clinical Impression:     Clinical Impression   Contusion (Primary)   Head injury   Lumbar back sprain   Contusion, buttock         Tivis Ringer, MD

## 2023-04-17 NOTE — ED Nurses Note (Signed)

## 2023-05-01 ENCOUNTER — Other Ambulatory Visit: Payer: Self-pay

## 2023-05-01 ENCOUNTER — Ambulatory Visit: Payer: 59 | Attending: Ophthalmology | Admitting: Ophthalmology

## 2023-05-01 ENCOUNTER — Ambulatory Visit (INDEPENDENT_AMBULATORY_CARE_PROVIDER_SITE_OTHER): Payer: 59

## 2023-05-01 DIAGNOSIS — H25813 Combined forms of age-related cataract, bilateral: Secondary | ICD-10-CM | POA: Insufficient documentation

## 2023-05-01 DIAGNOSIS — Z79899 Other long term (current) drug therapy: Secondary | ICD-10-CM | POA: Insufficient documentation

## 2023-05-01 DIAGNOSIS — H18603 Keratoconus, unspecified, bilateral: Secondary | ICD-10-CM | POA: Insufficient documentation

## 2023-05-01 DIAGNOSIS — H269 Unspecified cataract: Secondary | ICD-10-CM

## 2023-05-01 MED ORDER — PREDNISOLONE ACETATE 1 % EYE DROPS,SUSPENSION
1.0000 [drp] | Freq: Four times a day (QID) | OPHTHALMIC | 0 refills | Status: DC
Start: 2023-05-01 — End: 2023-07-03

## 2023-05-01 MED ORDER — OFLOXACIN 0.3 % EYE DROPS
1.0000 [drp] | Freq: Four times a day (QID) | OPHTHALMIC | 0 refills | Status: DC
Start: 2023-05-01 — End: 2024-01-21

## 2023-05-01 NOTE — Progress Notes (Addendum)
[facility]  OPHTHALMOLOGY, [facility]THSIDE PROFESSIONAL [facility]  [address]  [place] [address]  Dept: [phone]  Dept Fax: [phone]    Patient Name: Brian Stanley  MRN# [id number]    Date of Service: 05/01/2023    Chief Complaint:   Chief Complaint   Patient presents with    Cataract     New pt referred to [place] for cataract eval, pt has history of keratoconus. Pt complains of blurry vision, right eye is worse than left.pt has no history of ocular surgery.       Past History  Current Outpatient Medications   Medication Sig    aspirin (ECOTRIN) 81 mg Oral Tablet, Delayed Release (E.C.) Take 1 Tablet (81 mg total) by mouth    CHELATED ZINC ORAL Take 50 mg by mouth Once a day    empagliflozin (JARDIANCE) 25 mg Oral Tablet Take 1 Tablet (25 mg total) by mouth Every morning    Folic Acid 800 mcg Oral Tablet Take 1 Tablet (800 mcg total) by mouth    gabapentin (NEURONTIN) 300 mg Oral Capsule Take 1 Capsule (300 mg total) by mouth Every night for 90 days    insulin glargine-yfgn 100 Units/mL Subcutaneous Insulin Pen Inject 15 Units under the skin Every morning    lidocaine (LIDODERM) 5 % Adhesive Patch, Medicated Place on the skin    MULTIVITAMIN ORAL Take 1 Tablet by mouth Once a day    semaglutide (OZEMPIC) 1 mg/dose (4 mg/3 mL) Subcutaneous Pen Injector Inject 1 mg under the skin Every 7 days    Simethicone 125 mg Oral Capsule Take 1 Capsule (125 mg total) by mouth Once a day    solifenacin (VESICARE) 10 mg Oral Tablet Take 1 Tablet (10 mg total) by mouth Once a day    tadalafil (CIALIS) 20 mg Oral Tablet Take 1 Tablet (20 mg total) by mouth Every 72 hours as needed    tamsulosin (FLOMAX) 0.4 mg Oral Capsule Take 1 Capsule (0.4 mg total) by mouth Every night     Allergies   Allergen Reactions    Atorvastatin Myalgia     Other reaction(s): Muscle pain    Milk  Other Adverse Reaction (Add comment)     Lactose Intolerance? GI symptoms    Pravastatin Myalgia     Other reaction(s): Muscle  pain    Rosuvastatin Myalgia     Other reaction(s): Muscle pain    Sildenafil  Other Adverse Reaction (Add comment)     Other reaction(s): Headache    Simvastatin Myalgia     Other reaction(s): Muscle pain    Metformin Diarrhea     Past Medical History:   Diagnosis Date    Acute recurrent maxillary sinusitis     Basal cell carcinoma of back     Esophageal reflux     Glycosuria     Hypertension     Meralgia paresthetica of left side     Post-traumatic osteoarthritis of left knee     Proteinuria          Past Surgical History:   Procedure Laterality Date    HX APPENDECTOMY      HX BACK SURGERY      HX KNEE REPLACMENT      left         Family History  Family Medical History:       Problem Relation (Age of Onset)    Carpal tunnel syndrome Sister    Depression Mother  Diabetes type II Father    Elevated Lipids Father    Heart Attack Father    Hypertension (High Blood Pressure) Father    Stroke Mother            Social History  Social History     Tobacco Use    Smoking status: Never    Smokeless tobacco: Never   Substance Use Topics    Alcohol use: Yes     Comment: occasionally     Review of Systems       Geronimo Running, [place] 05/01/2023, 09:29     Base Eye Exam       Visual Acuity (Snellen - Linear)         Right Left    Dist cc 20/50 20/50    Dist ph cc 20/30 20/30      Correction: Glasses              Tonometry (Tonopen, 9:47 AM)         Right Left    Pressure 13 15              Neuro/Psych       Oriented x3: Yes    Mood/Affect: Normal              Dilation       Both eyes: 1.0% Mydriacyl, 2.5% Phenylephrine @ 9:48 AM                  Slit Lamp and Fundus Exam       Slit Lamp Exam         Right Left    Lids/Lashes Normal Normal    Conjunctiva/Sclera White and quiet White and quiet    Cornea Clear Clear    Anterior Chamber Deep and quiet Deep and quiet    Iris Round and reactive Round and reactive    Lens 2+ Cortical cataract, 2+ Nuclear sclerosis 2+ Cortical cataract, 2+ Nuclear sclerosis    Anterior Vitreous Normal  Normal              Fundus Exam         Right Left    Disc Normal Normal    C/D Ratio 0.5 0.5    Macula Normal Normal    Vessels Normal Normal    Periphery Normal Normal                  Refraction       Wearing Rx         Sphere Cylinder Axis Add    Right -5.50 +4.50 153 +2.00    Left -1.50 +2.75 016 +2.00      Type: PAL                    MD Addition to HPI:  Visual complaints, difficulty with reading, driving, performing daily activities, hx of keratoconus        ICD-10-CM    1. Cataracts, bilateral  H26.9 OPH IOL MASTER     OPH OCT BI      2. Combined form of age-related cataract, both eyes  H25.813       3. 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  CPT: Phaco with IOL  [id number]  The patient's impairment of visual function is believed not to be correctable with a tolerable change in glasses or contact lenses.  Cataract Bilateral eyes is believed to be significantly contributing to the patient's visual impairment.      Risks and benefits of cataract surgery reviewed with the patient including the risk of vision loss from infection or intraocular bleeding.  The increased risk of retinal detachment following cataract surgery was discussed.  Patient understands the risks and benefits and is agreeable to proceed.     There is a reasonable expectation that lens surgery will significantly improve both the visual and functional status of the patient.  The patient desires surgical correction.      Plan: Risks, benefits, and nature of cataract extraction with lens implant reviewed and patient consents to surgery. Plan topical anesthesia with Monitored Anesthesia Care (MAC). Patient to see surgery coordinator today to schedule surgery.     Anticipated lens implant: CNA0T0  Special needs:   OS +21.00 07/02/23,  OD +18.50 , keratoconus OU, POV1 same day for second eye  Co-management: No  OCT: Normal    Kathleen Argue, MD  05/01/2023, 10:21    I have seen and examined the  above patient. I discussed the above diagnoses listed in the assessment and the above ophthalmic plan of care with the patient and patient's family. All questions were answered. I reviewed and, when necessary, made changes to the technician/resident note, documented ophthalmology exam, chief complaint, history of present illness, allergies, review of systems, past medical, past surgical, family and social history. I personally reviewed and interpreted all testing and/or imaging performed at this visit and agree with the resident's or fellow's interpretation. Any exceptions/additions are edited/noted in the relevant encounter fields.  Kathleen Argue, MD  05/01/2023, 10:21

## 2023-05-08 ENCOUNTER — Ambulatory Visit (INDEPENDENT_AMBULATORY_CARE_PROVIDER_SITE_OTHER): Payer: Self-pay | Admitting: Ophthalmology

## 2023-06-24 ENCOUNTER — Encounter (HOSPITAL_COMMUNITY): Payer: Self-pay | Admitting: Ophthalmology

## 2023-06-29 ENCOUNTER — Ambulatory Visit (INDEPENDENT_AMBULATORY_CARE_PROVIDER_SITE_OTHER): Payer: Medicare PPO | Admitting: NEUROLOGY

## 2023-07-02 ENCOUNTER — Ambulatory Visit
Admission: RE | Admit: 2023-07-02 | Discharge: 2023-07-02 | Disposition: A | Discharge status: Home / Self Care | Payer: 59 | Source: Ambulatory Visit | Attending: Ophthalmology | Admitting: Ophthalmology

## 2023-07-02 ENCOUNTER — Ambulatory Visit (HOSPITAL_COMMUNITY): Payer: 59 | Admitting: Certified Registered"

## 2023-07-02 ENCOUNTER — Encounter (HOSPITAL_COMMUNITY)
Admission: RE | Disposition: A | Discharge status: Home / Self Care | Payer: Self-pay | Source: Ambulatory Visit | Attending: Ophthalmology

## 2023-07-02 ENCOUNTER — Ambulatory Visit (HOSPITAL_COMMUNITY): Payer: 59 | Admitting: Ophthalmology

## 2023-07-02 ENCOUNTER — Other Ambulatory Visit: Payer: Self-pay

## 2023-07-02 DIAGNOSIS — I252 Old myocardial infarction: Secondary | ICD-10-CM | POA: Insufficient documentation

## 2023-07-02 DIAGNOSIS — H25812 Combined forms of age-related cataract, left eye: Secondary | ICD-10-CM | POA: Insufficient documentation

## 2023-07-02 DIAGNOSIS — C801 Malignant (primary) neoplasm, unspecified: Secondary | ICD-10-CM | POA: Insufficient documentation

## 2023-07-02 DIAGNOSIS — E1142 Type 2 diabetes mellitus with diabetic polyneuropathy: Secondary | ICD-10-CM | POA: Insufficient documentation

## 2023-07-02 DIAGNOSIS — K219 Gastro-esophageal reflux disease without esophagitis: Secondary | ICD-10-CM | POA: Insufficient documentation

## 2023-07-02 DIAGNOSIS — E1136 Type 2 diabetes mellitus with diabetic cataract: Secondary | ICD-10-CM | POA: Insufficient documentation

## 2023-07-02 DIAGNOSIS — I10 Essential (primary) hypertension: Secondary | ICD-10-CM | POA: Insufficient documentation

## 2023-07-02 HISTORY — DX: Constipation, unspecified: K59.00

## 2023-07-02 HISTORY — DX: Presence of spectacles and contact lenses: Z97.3

## 2023-07-02 HISTORY — DX: Malignant (primary) neoplasm, unspecified: C80.1

## 2023-07-02 HISTORY — DX: Unspecified osteoarthritis, unspecified site: M19.90

## 2023-07-02 HISTORY — PX: HX CATARACT REMOVAL: SHX102

## 2023-07-02 HISTORY — DX: Other general symptoms and signs: R68.89

## 2023-07-02 HISTORY — DX: Type 2 diabetes mellitus without complications: E11.9

## 2023-07-02 HISTORY — DX: Acute myocardial infarction, unspecified: I21.9

## 2023-07-02 HISTORY — DX: Dorsopathy, unspecified: M53.9

## 2023-07-02 SURGERY — PHACO WITH INTRAOCULAR LENS
Anesthesia: Monitor Anesthesia Care | Site: Eye | Laterality: Left | Wound class: Clean Wound: Uninfected operative wounds in which no inflammation occurred

## 2023-07-02 MED ORDER — LIDOCAINE 1 %-PHENYLEPHRIN 1.5 % IN BALANCED SALT(PF) INTRAOCULAR SOLN
1.0000 mL | INJECTION | Freq: Once | INTRAOCULAR | Status: DC | PRN
Start: 2023-07-02 — End: 2023-07-02
  Administered 2023-07-02: 1 mL via OPHTHALMIC
  Filled 2023-07-02: qty 1

## 2023-07-02 MED ORDER — BRIMONIDINE 0.2 % EYE DROPS
1.0000 [drp] | Freq: Once | OPHTHALMIC | Status: DC | PRN
Start: 2023-07-02 — End: 2023-07-02
  Administered 2023-07-02: 1 [drp] via OPHTHALMIC
  Filled 2023-07-02: qty 5

## 2023-07-02 MED ORDER — TIMOLOL MALEATE 0.5 % EYE DROPS
1.0000 [drp] | Freq: Once | OPHTHALMIC | Status: DC | PRN
Start: 2023-07-02 — End: 2023-07-02
  Administered 2023-07-02: 1 [drp] via OPHTHALMIC
  Filled 2023-07-02: qty 5

## 2023-07-02 MED ORDER — CYCLOPENT 1 %-TROPICAMIDE 1 %-PROPARACAINE 0.1 %-PE-KETOROLAC EYE DROP
1.0000 [drp] | OPHTHALMIC | Status: AC
Start: 2023-07-02 — End: 2023-07-02
  Administered 2023-07-02 (×4): 1 [drp] via OPHTHALMIC
  Filled 2023-07-02: qty 1

## 2023-07-02 MED ORDER — PREDNISOLONE ACETATE 1 % EYE DROPS,SUSPENSION
1.0000 [drp] | Freq: Four times a day (QID) | OPHTHALMIC | Status: DC
Start: 2023-07-02 — End: 2023-07-09

## 2023-07-02 MED ORDER — EPINEPHRINE HCL (PF) 1 MG/ML (1 ML) INJECTION SOLUTION
Freq: Once | INTRAOCULAR | Status: DC | PRN
Start: 2023-07-02 — End: 2023-07-02
  Administered 2023-07-02: 270 mL via INTRAOCULAR

## 2023-07-02 MED ORDER — MOXIFLOXACIN 0.5 % EYE DROPS
1.0000 [drp] | OPHTHALMIC | Status: AC
Start: 2023-07-02 — End: 2023-07-02
  Administered 2023-07-02 (×4): 1 [drp] via OPHTHALMIC

## 2023-07-02 MED ORDER — TETRACAINE 0.5 % EYE DROPS
1.0000 [drp] | Freq: Once | OPHTHALMIC | Status: DC | PRN
Start: 2023-07-02 — End: 2023-07-02
  Administered 2023-07-02: 1 [drp] via OPHTHALMIC
  Filled 2023-07-02: qty 100

## 2023-07-02 MED ORDER — BALANCED SALT SOLUTION COMBINATION NO.2 INTRAOCULAR IRRIGATION
Freq: Once | INTRAOCULAR | Status: DC | PRN
Start: 2023-07-02 — End: 2023-07-02
  Administered 2023-07-02: 10 mL via OPHTHALMIC

## 2023-07-02 MED ORDER — EPINEPHRINE HCL (PF) 1 MG/ML (1 ML) INJECTION SOLUTION
Freq: Once | INTRAOCULAR | Status: DC | PRN
Start: 2023-07-02 — End: 2023-07-02
  Filled 2023-07-02: qty 1

## 2023-07-02 MED ORDER — MOXIFLOXACIN 0.5 % EYE DROPS
1.0000 [drp] | Freq: Three times a day (TID) | OPHTHALMIC | Status: DC
Start: 2023-07-02 — End: 2023-07-02
  Filled 2023-07-02: qty 3

## 2023-07-02 MED ORDER — CEFUROXIME 1 MG/0.1 ML OPHTH INJ
1.0000 mg | INJECTION | Freq: Once | INTRACAMERAL | Status: DC | PRN
Start: 2023-07-02 — End: 2023-07-02
  Administered 2023-07-02: 1 mg via INTRAMUSCULAR
  Filled 2023-07-02: qty 0.1

## 2023-07-02 MED ORDER — OFLOXACIN 0.3 % EYE DROPS
1.0000 [drp] | Freq: Four times a day (QID) | OPHTHALMIC | Status: DC
Start: 2023-07-02 — End: 2023-07-09

## 2023-07-02 SURGICAL SUPPLY — 17 items
CONV USE 48189 - SYRINGE MONOJECT 1ML LF  STRL REG TIP GRAD TB SIL POLYPROP EPOXY STD DISP (MED SURG SUPPLIES) ×1 IMPLANT
CONV USE ITEM 321863 - GLOVE SURG 6.5 LF PF SMTH BE_AD CUF STRL NTR 12IN PROTEXIS (GLOVES AND ACCESSORIES) ×2 IMPLANT
DEVICE VISCOELAS DUOVISC KIT SOL (OPHTHALMIC SUPPLIES (NOT LENS)) ×1 IMPLANT
EXPANDER OPTH 7MM MALYUGIN RING PUPIL DISP INJECTOR STRL (MED SURG SUPPLIES) IMPLANT
GLOVE SURG 7.5 LF  PF BEAD CUF STRL CRM 11.8IN PROTEXIS PI PLISPRN THK9.1 MIL (GLOVES AND ACCESSORIES) ×1 IMPLANT
GOWN SURG XL L3 REINF SET IN SLEEVE STRL LF  DISP BLU ASTND FBRC (DRAPE/PACKS/SHEETS/OR TOWEL) ×1 IMPLANT
LENS IOL +21 DIOP AUTONOME CLAREON ASPH HDRPHB ACRL STRL LF (IMPLANTS INTER-OCULAR LENS) ×1 IMPLANT
NEEDLE FILTER 1.5IN 18GA BLUNT BVL LL HUB DEHP-FR BD STRL LF  PURP PLYCRB 5UM REG WL DISP (MED SURG SUPPLIES) ×1 IMPLANT
NEEDLE HYPO  18GA 1IN MAGELLAN SHIELD 1 HAND ACT (MED SURG SUPPLIES) ×2 IMPLANT
PACK CUSTOM CATARACT ALCON (CUSTOM TRAYS & PACK) ×1 IMPLANT
SHIELD EYE 6.75X17IN 5.75IN ASST TWR TIDISHIELD RSPS DISPENSR LEN FRM DISP LF (OPHTHALMIC SUPPLIES (NOT LENS)) ×1 IMPLANT
SOL IRRG 0.9% NACL 1000ML PLASTIC PR BTL ISTNC N-PYRG STRL LF (MEDICATIONS/SOLUTIONS) ×1 IMPLANT
SOL IRRG BSS MLT ELCLY GLS CONTAINR 500ML (MED SURG SUPPLIES) ×1 IMPLANT
SOL IRRG BSS PLAIN 15ML STRL L (MED SURG SUPPLIES) IMPLANT
SOL IV 0.9% NACL 10ML DEHP-FR PRSV FR 1 DS PLASTIC FLIPTOP VIAL USP LF  LIGHT GRN (MEDICATIONS/SOLUTIONS) ×1 IMPLANT
SOL PREP BDINE 10% PVP 4OZ BTL PREOP SKIN PREP (MED SURG SUPPLIES) ×1 IMPLANT
SYRINGE LL 3ML LF  STRL GRAD N-PYRG DEHP-FR PVC FREE MED DISP CLR (MED SURG SUPPLIES) ×2 IMPLANT

## 2023-07-02 NOTE — Discharge Summary (Signed)
Discharge Note  Patient meets discharge criteria  Discharge to home  Return to [facility] as scheduled or sooner if need be  Keep surgical dressing in place    Kathleen Argue, MD 07/02/2023, 11:57

## 2023-07-02 NOTE — H&P (Signed)
[name]  [address]  [place] [facility] [address]  [facility]  History and Physical      Date:  07/02/2023   Brian Stanley, 73 y.o. male  Date of Birth:  09-17-1950    Complaint:   Decreased vision OS    HPI: Brian Stanley is a 73 y.o., male who presents with  Cataract OS        Past Medical History:   Diagnosis Date    Acute recurrent maxillary sinusitis     Arthritis     Back problem     Basal cell carcinoma of back     Cancer (CMS HCC)     skin cancer    Constipation     Esophageal reflux     Glycosuria     Hypertension     Meralgia paresthetica of left side     MI (myocardial infarction) (CMS HCC)     Neck problem     Post-traumatic osteoarthritis of left knee     Proteinuria     Type 2 diabetes mellitus (CMS HCC)     Wears glasses          Allergies   Allergen Reactions    Atorvastatin Myalgia     Other reaction(s): Muscle pain    Milk  Other Adverse Reaction (Add comment)     Lactose Intolerance? GI symptoms    Pravastatin Myalgia     Other reaction(s): Muscle pain    Rosuvastatin Myalgia     Other reaction(s): Muscle pain    Sildenafil  Other Adverse Reaction (Add comment)     Other reaction(s): Headache    Simvastatin Myalgia     Other reaction(s): Muscle pain    Metformin Diarrhea     Current Facility-Administered Medications   Medication Dose Route Frequency Provider Last Rate Last Admin    balanced salt (BSS) 500 mL with EPINEPHrine (ADRENALIN) 1 mg/mL (1:1,000) 1 mL intraocular   Intraocular INTRA-OP Once PRN Kathleen Argue, MD        brimonidine [facility]) 0.2% ophthalmic solution  1 Drop Left Eye INTRA-OP Once PRN Kathleen Argue, MD        cefUROXime (ZINACEF) 1 mg/0.1 mL intracameral injection  1 mg Injection INTRA-OP Once PRN Kathleen Argue, MD        lidocaine (PF) 1% + phenylephrine (PF) 1.5% in BSS ophthalmic injection  1 mL Left Eye INTRA-OP Once PRN Kathleen Argue, MD        moxifloxacin (VIGAMOX) 0.5 % ophthalmic  solution  1 Drop Left Eye 3x/day Kathleen Argue, MD        tetracaine (PONTOCAINE) 0.5 % ophthalmic solution  1 Drop Left Eye INTRA-OP Once PRN Kathleen Argue, MD        timolol (TIMOPTIC) 0.5% ophthalmic solution  1 Drop Left Eye INTRA-OP Once PRN Kathleen Argue, MD          Social History     Tobacco Use    Smoking status: Never    Smokeless tobacco: Never   Substance Use Topics    Alcohol use: Yes     Comment: occasionally     Family Medical History:       Problem Relation (Age of Onset)    Carpal tunnel syndrome Sister    Depression Mother    Diabetes type II Father    Elevated Lipids Father    Heart Attack Father    Hypertension (High Blood Pressure) Father  Stroke Mother              VZD:GLOVF than ROS in the HPI, all other systems were negative.    EXAM:    General: appears in good health, comfortable  Eyes: Performed at [facility].    HENT:Head atraumatic and normocephalic  Neck: no thyromegaly or lymphadenopathy  Lungs: Breathing nonlabored  Cardiovascular: regular rate and rhythm on the monitor: non-distended  Extremities: No cyanosis or deformity    ASSESSMENT/PLAN:    Cataract extraction with lens implant  OS     Kathleen Argue, MD 07/02/2023, 11:22

## 2023-07-02 NOTE — OR Surgeon (Signed)
[facility]   DEPARTMENT OF OPHTHALMOLOGY   OPERATION SUMMARY     PATIENT NAME: Brian Stanley   HOSPITAL NUMBER: [id number]   DATE OF SERVICE: 07/02/2023   DATE OF BIRTH: 07-29-1950    PREOPERATIVE DIAGNOSIS: Cataract, left eye.Combined  H25.812  NAME OF PROCEDURE: Phacoemulsification of cataract with posterior chamber lens implant, left eye.  Cataract Removal with Implant   CPT [phone]  SURGEONS: Kathleen Argue MD (staff),  (assistant).   ANESTHESIA: MAC, topical.   ESTIMATED BLOOD LOSS: Less than 1 drop.   COMPLICATIONS: There were no complications.   SPECIMENS: None.     DESCRIPTION OF PROCEDURE: In the preanesthesia area, the patient was given a drop of topical betadine and topical Tetracaine. The patient was taken back to the operating room suite.  Topical Akten (lidocaine gel) was applied.  A surgical Time-Out confirmed the correct patient, procedure, operative eye, and IOL model and power.  The eye was then prepped and draped in the usual sterile fashion. A speculum was placed in the eye. A disposable supersharp blade was used to make a 1-mm side port entry through clear cornea along the limbus, and 1 mL of 1% preservative-free Xylocaine was irrigated into the anterior chamber. The anterior chamber was filled with viscoelastic material. The keratome was used to make a 2.4-mm phaco port incision through clear cornea temporally along the limbus. The cystotome and capsulorrhexis forceps were used to create a continuous curvilinear capsulorrhexis. Hydrodissection was performed with balanced salt solution on a cannula. The tip of the phacoemulsification handpiece was introduced into the eye, and the lens nucleus was emulsified using a bimanual nuclear splitting technique.  The phaco tip was removed and the tip of the I/A handpiece introduced into the eye. The remaining lens cortical material was aspirated from the eye. The I/A tip was removed, and the capsular bag was inflated with viscoelastic  material. The posterior chamber lens implant, CNA0T0, of +21.00 diopters power was brought onto the field and inspected. It was without defect and was loaded into the lens injector. The tip of the injector was inserted into the eye, and the lens implant was placed in the capsular bag. The injector tip was removed and the tip of the I/A handpiece reintroduced into the eye. The remaining viscoelastic material was aspirated. The I/A tip was removed, and the anterior chamber was pressurized with balanced salt solution. The anterior wound margins were hydrated with balanced salt solution and 1mg  of Zinacef was instilled into the anterior chamber.  Weck-cel sponges were gently brushed against the incision and confirmed that there was no wound leakage.  The speculum was removed from the eye, and the eye was bandaged with a rigid shield. The patient was returned to same day surgery in excellent condition. Dr. Christell Constant was present for the key and critical portions of the case and immediately available at all times.        Resident   [place] Department of Ophthalmology     Brett Fairy, MD   Assistant Professor   [facility] Department of Ophthalmology

## 2023-07-02 NOTE — Discharge Instructions (Addendum)
SURGICAL DISCHARGE INSTRUCTIONS     Dr. Kathleen Argue, MD  performed your [facility] WITH INTRAOCULAR LENS today at the [facility]                                   These instructions have been reviewed with the patient and appropriate questions have answers.  Darcel Smalling, RN 07/02/2023 10:05                                                 PLEASE SEE WRITTEN HANDOUTS AS DISCUSSED BY YOUR NURSE:      SIGNS AND SYMPTOMS OF A WOUND / INCISION INFECTION   Be sure to watch for the following:  Increase in redness or red streaks near or around the wound or incision.  Increase in pain that is intense or severe and cannot be relieved by the pain medication that your doctor has given you.  Increase in swelling that cannot be relieved by elevation of a body part, or by applying ice, if permitted.  Increase in drainage, or if yellow / green in color and smells bad. This could be on a dressing or a cast.  Increase in fever for longer than 24 hours, or an increase that is higher than 101 degrees Fahrenheit (normal body temperature is 98 degrees Fahrenheit). The incision may feel warm to the touch.    **CALL YOUR DOCTOR IF ONE OR MORE OF THESE SIGNS / SYMPTOMS SHOULD OCCUR.    ANESTHESIA INFORMATION   LOCAL ANESTHETIC:  You have receieved a local anesthetic, the effects should disappear in a few hours.    REMEMBER   If you experience any difficulty breathing, chest pain, bleeding that you feel is excessive, persistent nausea or vomiting or for any other concerns:  Call your physician Dr.  Kathleen Argue, MD [phone] at  or [phone] You may also ask to have the general doctor on call paged. They are available to you 24 hours a day.    SPECIAL INSTRUCTIONS / COMMENTS   Follow up as directed    FOLLOW-UP APPOINTMENTS   Please call your surgeon's office at the number listed about  to schedule a date / time of return.

## 2023-07-02 NOTE — Anesthesia Preprocedure Evaluation (Signed)
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      No endotracheal tube present  No Tracheostomy present    Dental           (+) chipped           Pulmonary    Breath sounds clear to auscultation       Cardiovascular    Rhythm: regular  Rate: Normal       Other findings              Plan  ASA 3     Planned anesthesia type: MAC                         Intravenous induction     Anesthesia issues/risks discussed are: PONV, Dental Injuries, Aspiration, Stroke, Sore Throat, Cardiac Events/MI and Intraoperative Awareness/ Recall.  Anesthetic plan and risks discussed with patient  signed consent obtained          Patient's NPO status is appropriate for Anesthesia.

## 2023-07-02 NOTE — Progress Notes (Signed)
Procedure: Phacoemulsification of cataract with lens implant left eye(s).    Pre-op diagnosis: Cataract left eye(s).      The patient has been thoroughly counseled as to the indications, alternatives, and potential risks of the procedures including, but not limited to, failure of operation, need for further surgery, scar, complications from anesthesia, loss of vision, infection, bleeding, corneal abrasion.    The patient understands the procedure with its risks and benefits, and desires to proceed as outlined above.    Kathleen Argue, MD 07/02/2023, 11:23

## 2023-07-02 NOTE — Anesthesia Transfer of Care (Signed)
ANESTHESIA TRANSFER OF CARE   Brian Stanley is a 73 y.o. ,male, Weight: 87.5 kg (193 lb)   had Procedure(s):  PHACO WITH INTRAOCULAR LENS  performed  07/02/23   Primary Service: Kathleen Argue, MD    Past Medical History:   Diagnosis Date    Acute recurrent maxillary sinusitis     Arthritis     Back problem     Basal cell carcinoma of back     Cancer (CMS [facility])     skin cancer    Constipation     Esophageal reflux     Glycosuria     Hypertension     Meralgia paresthetica of left side     MI (myocardial infarction) (CMS HCC)     Neck problem     Post-traumatic osteoarthritis of left knee     Proteinuria     Type 2 diabetes mellitus (CMS HCC)     Wears glasses       Allergy History as of 07/02/23       METFORMIN         Noted Status Severity Type Reaction    01/02/22 0740 Quincy Simmonds, [place] 09/02/19 Active Low  Diarrhea              MILK         Noted Status Severity Type Reaction    01/02/22 1250 Quincy Simmonds, [place] 03/18/16 Active Medium   Other Adverse Reaction (Add comment)    Comments: Lactose Intolerance? GI symptoms     01/02/22 1250 Quincy Simmonds, [place] 03/18/16 Active High   Other Adverse Reaction (Add comment)    Comments: Lactose Intolerance? GI symptoms     01/02/22 1248 Quincy Simmonds, [place] 03/18/16 Active    Other Adverse Reaction (Add comment)    Comments: Lactose Intolerance? GI symptoms     01/02/22 0740 Quincy Simmonds, [place] 03/18/16 Active                 ATORVASTATIN         Noted Status Severity Type Reaction    01/08/22 0820 Quincy Simmonds, [place] 09/02/19 Active Medium  Myalgia    Comments: Other reaction(s): Muscle pain     01/02/22 1250 Quincy Simmonds, [place] 09/02/19 Active Medium  Myalgia    Comments: Other reaction(s): Muscle pain     01/02/22 1250 Quincy Simmonds, [place] 09/02/19 Active   Myalgia    Comments: Other reaction(s): Muscle pain     01/02/22 0740 Quincy Simmonds, [place] 09/02/19 Active       Comments: Other reaction(s): Muscle pain               PRAVASTATIN          Noted Status Severity Type Reaction    01/08/22 0820 Quincy Simmonds, [place] 09/02/19 Active Medium  Myalgia    Comments: Other reaction(s): Muscle pain     01/02/22 1249 Quincy Simmonds, [place] 09/02/19 Active Medium  Myalgia    Comments: Other reaction(s): Muscle pain     01/02/22 0740 Quincy Simmonds, [place] 09/02/19 Active       Comments: Other reaction(s): Muscle pain               ROSUVASTATIN         Noted Status Severity Type Reaction    01/08/22 0820 Quincy Simmonds, [place] 01/12/20 Active Medium  Myalgia    Comments: Other reaction(s): Muscle pain     01/02/22 1251 Quincy Simmonds, [place] 01/12/20 Active Medium  Myalgia  Comments: Other reaction(s): Muscle pain     01/02/22 0740 Quincy Simmonds, [place] 01/12/20 Active       Comments: Other reaction(s): Muscle pain               SIMVASTATIN         Noted Status Severity Type Reaction    01/08/22 0820 Quincy Simmonds, [place] 09/02/19 Active Medium  Myalgia    Comments: Other reaction(s): Muscle pain     01/02/22 1249 Quincy Simmonds, [place] 09/02/19 Active Medium  Myalgia    Comments: Other reaction(s): Muscle pain     01/02/22 1249 Quincy Simmonds, [place] 09/02/19 Active   Myalgia    Comments: Other reaction(s): Muscle pain     01/02/22 0740 Quincy Simmonds, [place] 09/02/19 Active       Comments: Other reaction(s): Muscle pain               SILDENAFIL         Noted Status Severity Type Reaction    01/08/22 0820 Quincy Simmonds, [place] 04/18/16 Active Medium   Other Adverse Reaction (Add comment)    Comments: Other reaction(s): Headache     01/02/22 1251 Quincy Simmonds, [place] 04/18/16 Active Medium   Other Adverse Reaction (Add comment)    Comments: Other reaction(s): Headache     01/02/22 0740 Quincy Simmonds, MA 04/18/16 Active       Comments: Other reaction(s): Headache                   I completed my transfer of care / handoff to the receiving personnel during which we discussed:  Access, Airway, All key/critical aspects of case discussed, Analgesia,  Antibiotics, Expectation of post procedure, Fluids/Product, Gave opportunity for questions and acknowledgement of understanding, Labs and PMHx      Post Location: PACU                                                             Last OR Temp: Temperature: 36.1 C (97 F)  ABG:   Airway:* No LDAs found *  Blood pressure (!) 125/59, pulse 72, temperature 36.1 C (97 F), resp. rate 18, height 1.854 m (6\' 1" ), weight 87.5 kg (193 lb), SpO2 97%.

## 2023-07-02 NOTE — Anesthesia Postprocedure Evaluation (Signed)
Anesthesia Post Op Evaluation    Patient: Brian Stanley  Procedure(s):  [facility] WITH INTRAOCULAR LENS    Last Vitals:Temperature: 36.1 C (97 F) (07/02/23 1156)  Heart Rate: 72 (07/02/23 1156)  BP (Non-Invasive): (!) 125/59 (07/02/23 1156)  Respiratory Rate: 18 (07/02/23 1156)  SpO2: 97 % (07/02/23 1156)    No notable events documented.      Patient location during evaluation: PACU       Patient participation: complete - patient participated  Level of consciousness: awake and alert and responsive to verbal stimuli    Pain management: adequate  Airway patency: patent    Anesthetic complications: no  Cardiovascular status: acceptable  Respiratory status: acceptable, room air, spontaneous ventilation and nonlabored ventilation  Hydration status: acceptable  Patient post-procedure temperature: Pt Normothermic   PONV Status: Absent

## 2023-07-03 ENCOUNTER — Ambulatory Visit: Payer: 59 | Attending: Ophthalmology | Admitting: Ophthalmology

## 2023-07-03 ENCOUNTER — Encounter (INDEPENDENT_AMBULATORY_CARE_PROVIDER_SITE_OTHER): Payer: Self-pay | Admitting: Ophthalmology

## 2023-07-03 DIAGNOSIS — Z48810 Encounter for surgical aftercare following surgery on the sense organs: Secondary | ICD-10-CM | POA: Insufficient documentation

## 2023-07-03 DIAGNOSIS — H25812 Combined forms of age-related cataract, left eye: Secondary | ICD-10-CM | POA: Insufficient documentation

## 2023-07-03 DIAGNOSIS — Z961 Presence of intraocular lens: Secondary | ICD-10-CM | POA: Insufficient documentation

## 2023-07-03 DIAGNOSIS — E119 Type 2 diabetes mellitus without complications: Secondary | ICD-10-CM | POA: Insufficient documentation

## 2023-07-03 DIAGNOSIS — E1136 Type 2 diabetes mellitus with diabetic cataract: Secondary | ICD-10-CM | POA: Insufficient documentation

## 2023-07-03 MED ORDER — PREDNISOLONE ACETATE 1 % EYE DROPS,SUSPENSION
1.0000 [drp] | Freq: Four times a day (QID) | OPHTHALMIC | 0 refills | Status: DC
Start: 2023-07-03 — End: 2024-01-21

## 2023-07-03 NOTE — Progress Notes (Addendum)
[facility]  OPHTHALMOLOGY, NORTHSIDE PROFESSIONAL [facility]  [address]  [place] [address]  Dept: [phone]  Dept Fax: [phone]    Patient Name: Brian Stanley  MRN# [id number]    Date of Service: 07/03/2023    Chief Complaint:   Chief Complaint   Patient presents with    Post-op Eye       Past History  Current Outpatient Medications   Medication Sig    aspirin (ECOTRIN) 81 mg Oral Tablet, Delayed Release (E.C.) Take 1 Tablet (81 mg total) by mouth    CHELATED ZINC ORAL Take 50 mg by mouth Once a day    cyanocobalamin (VITAMIN B 12) 1,000 mcg Oral Tablet Take 1 Tablet (1,000 mcg total) by mouth Once a day    empagliflozin (JARDIANCE) 25 mg Oral Tablet Take 1 Tablet (25 mg total) by mouth Every morning    Folic Acid 800 mcg Oral Tablet Take 1 Tablet (800 mcg total) by mouth    gabapentin (NEURONTIN) 300 mg Oral Capsule Take 1 Capsule (300 mg total) by mouth Every night for 90 days    insulin glargine-yfgn 100 Units/mL Subcutaneous Insulin Pen Inject 15 Units under the skin Every morning    lidocaine (LIDODERM) 5 % Adhesive Patch, Medicated Place on the skin    magnesium Oxide 420 mg Oral Tablet Take 1 Tablet (420 mg total) by mouth    MULTIVITAMIN ORAL Take 1 Tablet by mouth Once a day    niacin, vitamin B3, (NIACOR) 50 mg Oral Tablet Take 1 Tablet (50 mg total) by mouth Three times daily with meals    ofloxacin (OCUFLOX) 0.3 % Ophthalmic Drops Ophthalmic Solution Administer 1-2 Drops into the left eye Four times a day    ofloxacin (OCUFLOX) 0.3 % Ophthalmic Drops Ophthalmic Solution Administer 1 Drop into the left eye Four times a day    prednisoLONE acetate (PRED FORTE) 1 % Ophthalmic Drops, Suspension Administer 1 Drop into the left eye Four times a day    prednisoLONE acetate (PRED FORTE) 1 % Ophthalmic Drops, Suspension Administer 1 Drop into the left eye Four times a day    semaglutide (OZEMPIC) 1 mg/dose (4 mg/3 mL) Subcutaneous Pen Injector Inject 1 mg under the skin  Every 7 days    Simethicone 125 mg Oral Capsule Take 1 Capsule (125 mg total) by mouth Once a day (Patient not taking: Reported on 06/24/2023)    solifenacin (VESICARE) 10 mg Oral Tablet Take 1 Tablet (10 mg total) by mouth Once a day    tadalafil (CIALIS) 20 mg Oral Tablet Take 1 Tablet (20 mg total) by mouth Every 72 hours as needed    tamsulosin (FLOMAX) 0.4 mg Oral Capsule Take 1 Capsule (0.4 mg total) by mouth Every night     Allergies   Allergen Reactions    Atorvastatin Myalgia     Other reaction(s): Muscle pain    Milk  Other Adverse Reaction (Add comment)     Lactose Intolerance? GI symptoms    Pravastatin Myalgia     Other reaction(s): Muscle pain    Rosuvastatin Myalgia     Other reaction(s): Muscle pain    Sildenafil  Other Adverse Reaction (Add comment)     Other reaction(s): Headache    Simvastatin Myalgia     Other reaction(s): Muscle pain    Metformin Diarrhea     Past Medical History:   Diagnosis Date    Acute recurrent maxillary sinusitis     Arthritis  Back problem     Basal cell carcinoma of back     Cancer (CMS HCC)     skin cancer    Constipation     Esophageal reflux     Glycosuria     Hypertension     Meralgia paresthetica of left side     MI (myocardial infarction) (CMS HCC)     Neck problem     Post-traumatic osteoarthritis of left knee     Proteinuria     Type 2 diabetes mellitus (CMS HCC)     Wears glasses          Past Surgical History:   Procedure Laterality Date    HX APPENDECTOMY      HX BACK SURGERY      HX KNEE REPLACMENT      left         Family History  Family Medical History:       Problem Relation (Age of Onset)    Carpal tunnel syndrome Sister    Depression Mother    Diabetes type II Father    Elevated Lipids Father    Heart Attack Father    Hypertension (High Blood Pressure) Father    Stroke Mother            Social History  Social History     Tobacco Use    Smoking status: Never    Smokeless tobacco: Never   Substance Use Topics    Alcohol use: Yes     Comment: occasionally      Review of Systems       Geronimo Running, [place] 07/03/2023, 08:33     Base Eye Exam       Visual Acuity (Snellen - Linear)         Right Left    Dist sc  20/70    Dist ph sc  20/25              Tonometry (Tonopen, 8:31 AM)         Right Left    Pressure 14 11              Visual Fields         Right Left     Full Full              Extraocular Movement         Right Left     Full Full                  Slit Lamp and Fundus Exam       Slit Lamp Exam         Right Left    Cornea  Clear corneal incision    Anterior Chamber  Cell    Iris  dilated    Lens  Posterior chamber intraocular lens                    MD Addition to HPI:  Patient here for post op visit #1 left. Patient reports mild pain following cataract surgery      This patient has a satisfactory Post-op visit #1 following cataract extraction with PCIOL left eye. There is mild corneal edema. There is no elevation of the intraocular pressure..    Plan: Vigamox/Ocuflox 1 drop QID to operative eye. Pred Forte 1 Drop QID to operatve eye. Patient instructed in proper drop usage. Patient advised to wear shield over operative eye at bedtime for one week.  Return to clinic in approximately one week. Patient instructed to call [facility] with any change in vision, pain, or redness between now and next visit. Patient is to avoid activities that might result in bumping the eye and patient is not to immerse head under water. Showering is permitted.     I reviewed and made necessary changes to the technician, resident or fellow note regarding CC/HPI/ROS/Past Family, Medical and Social Hx/Surg Hx.    Kathleen Argue, MD  07/03/2023, 08:41

## 2023-07-09 ENCOUNTER — Ambulatory Visit (INDEPENDENT_AMBULATORY_CARE_PROVIDER_SITE_OTHER): Payer: Medicare PPO | Admitting: Family Medicine

## 2023-07-09 ENCOUNTER — Encounter (INDEPENDENT_AMBULATORY_CARE_PROVIDER_SITE_OTHER): Payer: Self-pay | Admitting: Family Medicine

## 2023-07-09 ENCOUNTER — Other Ambulatory Visit: Payer: Self-pay

## 2023-07-09 VITALS — BP 134/73 | HR 71 | Temp 98.0°F | Resp 18 | Ht 73.0 in | Wt 191.0 lb

## 2023-07-09 DIAGNOSIS — I119 Hypertensive heart disease without heart failure: Secondary | ICD-10-CM | POA: Insufficient documentation

## 2023-07-09 DIAGNOSIS — G629 Polyneuropathy, unspecified: Secondary | ICD-10-CM

## 2023-07-09 DIAGNOSIS — R319 Hematuria, unspecified: Secondary | ICD-10-CM | POA: Insufficient documentation

## 2023-07-09 DIAGNOSIS — Z794 Long term (current) use of insulin: Secondary | ICD-10-CM

## 2023-07-09 DIAGNOSIS — F330 Major depressive disorder, recurrent, mild: Secondary | ICD-10-CM

## 2023-07-09 DIAGNOSIS — Z7984 Long term (current) use of oral hypoglycemic drugs: Secondary | ICD-10-CM

## 2023-07-09 DIAGNOSIS — Z719 Counseling, unspecified: Secondary | ICD-10-CM | POA: Insufficient documentation

## 2023-07-09 DIAGNOSIS — Z7985 Long-term (current) use of injectable non-insulin antidiabetic drugs: Secondary | ICD-10-CM

## 2023-07-09 DIAGNOSIS — E1140 Type 2 diabetes mellitus with diabetic neuropathy, unspecified: Secondary | ICD-10-CM

## 2023-07-09 DIAGNOSIS — N400 Enlarged prostate without lower urinary tract symptoms: Secondary | ICD-10-CM

## 2023-07-09 DIAGNOSIS — E538 Deficiency of other specified B group vitamins: Secondary | ICD-10-CM

## 2023-07-09 DIAGNOSIS — E782 Mixed hyperlipidemia: Secondary | ICD-10-CM

## 2023-07-09 DIAGNOSIS — I10 Essential (primary) hypertension: Secondary | ICD-10-CM

## 2023-07-09 MED ORDER — TAMSULOSIN 0.4 MG CAPSULE
0.4000 mg | ORAL_CAPSULE | Freq: Every evening | ORAL | 2 refills | Status: AC
Start: 2023-07-09 — End: ?

## 2023-07-09 NOTE — Nursing Note (Signed)
07/09/23 0808   PHQ 9 (follow up)   Little interest or pleasure in doing things. 0   Feeling down, depressed, or hopeless 0

## 2023-07-09 NOTE — Progress Notes (Signed)
[facility], MEDICAL OFFICE BUILDING  [address]  [place] [address]       Name: Brian Stanley MRN:  [id number]   Date: 07/09/2023 Age: 73 y.o.          Provider: Mickey Farber, DO    Reason for visit: Follow Up 6 Months      History of Present Illness:  07/09/2023:  73 year old returns complains of ringing ears for which he had cataract Levada Schilling going to left scheduled to be right.  His A1c was 6.7 from 6.6.  This morning his blood sugar was 107 continues to use and benefit from his freestyle Libre continuous glucose monitor and he is time in range is 88% for 90 days.  He fell when he went to the ER [facility] ER CT scans done but he still getting build by radiologist in [place] said that should have been billed to the [facility].  He was put on Ozempic 2 mg told to reduce the Jardiance the 1/2 a pill has a burning on urination.  We needs a refill on his Flomax.  He denies nausea vomiting diarrhea constipation syncope or near-syncope.  I reviewed the labs he had done at the [facility] which showed a vitamin-D level 41.8 urine was normal except for glucose was a moderate amount of blood present also hemoglobin was 8 hematocrit 54 poin2 creatinine 1.2 non HDL cholesterol 157 GFR 66 glucose 134 BUN 19 liver enzymes were normal cholesterol was 207 LDL C was 139 HDL 50 triglycerides 90 TSH was 1.950 B12 317.  Historical Data    Past Medical History:  Past Medical History:   Diagnosis Date    Acute recurrent maxillary sinusitis     Arthritis     Back problem     Basal cell carcinoma of back     Cancer (CMS HCC)     skin cancer    Constipation     Esophageal reflux     Glycosuria     Hypertension     Meralgia paresthetica of left side     MI (myocardial infarction) (CMS HCC)     Neck problem     Post-traumatic osteoarthritis of left knee     Proteinuria     Type 2 diabetes mellitus (CMS HCC)     Wears glasses          Past Surgical History:  Past  Surgical History:   Procedure Laterality Date    HX APPENDECTOMY      HX BACK SURGERY      HX KNEE REPLACMENT      left         Allergies:  Allergies   Allergen Reactions    Atorvastatin Myalgia     Other reaction(s): Muscle pain    Rosuvastatin Myalgia     Other reaction(s): Muscle pain    Sildenafil  Other Adverse Reaction (Add comment)     Other reaction(s): Headache    Simvastatin Myalgia     Other reaction(s): Muscle pain     Medications:  Current Outpatient Medications   Medication Sig    aspirin (ECOTRIN) 81 mg Oral Tablet, Delayed Release (E.C.) Take 1 Tablet (81 mg total) by mouth    CHELATED ZINC ORAL Take 50 mg by mouth Once a day    cyanocobalamin (VITAMIN B 12) 1,000 mcg Oral Tablet Take 1 Tablet (1,000 mcg total) by mouth Once a day    empagliflozin (JARDIANCE) 25 mg Oral  Tablet Take 0.5 Tablets (12.5 mg total) by mouth Every morning    Folic Acid 800 mcg Oral Tablet Take 1 Tablet (800 mcg total) by mouth    insulin glargine-yfgn 100 Units/mL Subcutaneous Insulin Pen Inject 15 Units under the skin Every morning    lidocaine (LIDODERM) 5 % Adhesive Patch, Medicated Place on the skin    magnesium Oxide 420 mg Oral Tablet Take 1 Tablet (420 mg total) by mouth    MULTIVITAMIN ORAL Take 1 Tablet by mouth Once a day    niacin, vitamin B3, (NIACOR) 50 mg Oral Tablet Take 1 Tablet (50 mg total) by mouth Three times daily with meals    ofloxacin (OCUFLOX) 0.3 % Ophthalmic Drops Ophthalmic Solution Administer 1-2 Drops into the left eye Four times a day    prednisoLONE acetate (PRED FORTE) 1 % Ophthalmic Drops, Suspension Administer 1 Drop into the left eye Four times a day    semaglutide 2 mg/dose (8 mg/3 mL) Subcutaneous Pen Injector Inject 1 mg under the skin Every 7 days    solifenacin (VESICARE) 10 mg Oral Tablet Take 1 Tablet (10 mg total) by mouth Once a day    tamsulosin (FLOMAX) 0.4 mg Oral Capsule Take 1 Capsule (0.4 mg total) by mouth Every night     Family History:  Family Medical History:        Problem Relation (Age of Onset)    Carpal tunnel syndrome Sister    Depression Mother    Diabetes type II Father    Elevated Lipids Father    Heart Attack Father    Hypertension (High Blood Pressure) Father    Stroke Mother            Social History:  Social History     Socioeconomic History    Marital status: Married   Tobacco Use    Smoking status: Never    Smokeless tobacco: Never   Vaping Use    Vaping status: Never Used   Substance and Sexual Activity    Alcohol use: Yes     Comment: occasionally    Drug use: Never           Review of Systems:  Any pertinent Review of Systems as addressed in the HPI above.    Physical Exam:  Vital Signs:  Vitals:    07/09/23 0811   BP: 134/73   Pulse: 71   Resp: 18   Temp: 36.7 C (98 F)   TempSrc: Temporal   SpO2: 96%   Weight: 86.6 kg (191 lb)   Height: 1.854 m (6\' 1" )   BMI: 25.2     Physical Exam  Vitals and nursing note reviewed.   Constitutional:       Appearance: Normal appearance. He is well-developed and well-groomed.   HENT:      Head: Normocephalic and atraumatic.      Right Ear: Tympanic membrane, ear canal and external ear normal.      Left Ear: Tympanic membrane, ear canal and external ear normal.      Nose: Nose normal.      Mouth/Throat:      Mouth: Mucous membranes are moist.      Pharynx: Oropharynx is clear.   Eyes:      Extraocular Movements: Extraocular movements intact.      Conjunctiva/sclera: Conjunctivae normal.      Pupils: Pupils are equal, round, and reactive to light.   Cardiovascular:      Rate and Rhythm: Normal  rate and regular rhythm.      Pulses: Normal pulses.           Carotid pulses are 2+ on the right side and 2+ on the left side.       Radial pulses are 2+ on the right side and 2+ on the left side.        Dorsalis pedis pulses are 2+ on the right side and 2+ on the left side.        Posterior tibial pulses are 2+ on the right side.      Heart sounds: Normal heart sounds, S1 normal and S2 normal.   Pulmonary:      Effort: Pulmonary effort  is normal.      Breath sounds: Normal breath sounds.   Abdominal:      General: Abdomen is protuberant.      Palpations: Abdomen is soft.      Tenderness: There is no abdominal tenderness. There is no right CVA tenderness or left CVA tenderness.   Musculoskeletal:         General: Normal range of motion.      Cervical back: Normal range of motion and neck supple.      Right lower leg: No edema.      Left lower leg: No edema.   Skin:     General: Skin is warm and dry.      Capillary Refill: Capillary refill takes less than 2 seconds.   Neurological:      General: No focal deficit present.      Mental Status: He is alert and oriented to person, place, and time. Mental status is at baseline.      Cranial Nerves: Cranial nerves 2-12 are intact.      Sensory: Sensation is intact.      Coordination: Coordination is intact.      Gait: Gait is intact.   Psychiatric:         Mood and Affect: Mood normal.         Behavior: Behavior normal. Behavior is cooperative.       Assessment:    ICD-10-CM    1. Type 2 diabetes mellitus with diabetic neuropathy, unspecified (CMS HCC)  E11.40       2. 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 Essential hypertension  I10       4. Hyperlipidemia, mixed  E78.2       5. Neuropathy (CMS HCC)  G62.9       6. Vitamin B12 deficiency (non anemic)  E53.8       7. Hypomagnesemia  E83.42       8. Benign prostatic hyperplasia without lower urinary tract symptoms  N40.0          Plan:  Orders Placed This Encounter    tamsulosin (FLOMAX) 0.4 mg Oral Capsule     Today I refilled his tamsulosin for benign prostatic hypertrophy which he is tolerating well.  He will continue his magnesium for hypomagnesemia in his taking himself off of pain medication and gabapentin for his diabetic peripheral neuropathy.  He has got his diabetes much better controlled now his mood is well controlled in his not taking anything for depression he also takes VESIcare for his BPH.  He takes  B12 for B12 deficiency.  His blood pressure is well controlled without any medications at this time.  He continues on Jardiance and semaglutide for his  diabetes.  More than 50% of the visit was spent counseling and coordinating care.  All questions were answered to his satisfaction of the patient.    Return in about 6 months (around 01/06/2024).    Mickey Farber, DO     Portions of this note may be dictated using voice recognition software or a dictation service. Variances in spelling and vocabulary are possible and unintentional. Not all errors are caught/corrected. Please notify the There[facility] Parkin if any discrepancies are noted or if the meaning of any statement is not clear.

## 2023-07-09 NOTE — Nursing Note (Signed)
07/09/23 3016   Domestic Violence   Because we are aware of abuse and domestic violence today, we ask all patients: Are you being hurt, hit, or frightened by anyone at your home or in your life?  N   Basic Needs   Do you have any basic needs within your home that are not being met? (such as Food, Shelter, [employer], Tranportation, paying for bills and/or medications) N

## 2023-07-13 ENCOUNTER — Telehealth (HOSPITAL_COMMUNITY): Payer: Self-pay

## 2023-07-14 ENCOUNTER — Encounter (HOSPITAL_COMMUNITY): Payer: Self-pay | Admitting: Ophthalmology

## 2023-07-23 ENCOUNTER — Ambulatory Visit (HOSPITAL_COMMUNITY): Payer: 59 | Admitting: Ophthalmology

## 2023-07-23 ENCOUNTER — Ambulatory Visit
Admission: RE | Admit: 2023-07-23 | Discharge: 2023-07-23 | Disposition: A | Discharge status: Home / Self Care | Payer: 59 | Source: Ambulatory Visit | Attending: Ophthalmology | Admitting: Ophthalmology

## 2023-07-23 ENCOUNTER — Other Ambulatory Visit: Payer: Self-pay

## 2023-07-23 ENCOUNTER — Ambulatory Visit (HOSPITAL_COMMUNITY): Payer: 59

## 2023-07-23 ENCOUNTER — Encounter (HOSPITAL_COMMUNITY)
Admission: RE | Disposition: A | Discharge status: Home / Self Care | Payer: Self-pay | Source: Ambulatory Visit | Attending: Ophthalmology

## 2023-07-23 DIAGNOSIS — E1142 Type 2 diabetes mellitus with diabetic polyneuropathy: Secondary | ICD-10-CM | POA: Insufficient documentation

## 2023-07-23 DIAGNOSIS — C801 Malignant (primary) neoplasm, unspecified: Secondary | ICD-10-CM | POA: Insufficient documentation

## 2023-07-23 DIAGNOSIS — Z7984 Long term (current) use of oral hypoglycemic drugs: Secondary | ICD-10-CM | POA: Insufficient documentation

## 2023-07-23 DIAGNOSIS — I10 Essential (primary) hypertension: Secondary | ICD-10-CM | POA: Insufficient documentation

## 2023-07-23 DIAGNOSIS — I252 Old myocardial infarction: Secondary | ICD-10-CM | POA: Insufficient documentation

## 2023-07-23 DIAGNOSIS — E1136 Type 2 diabetes mellitus with diabetic cataract: Secondary | ICD-10-CM | POA: Insufficient documentation

## 2023-07-23 DIAGNOSIS — H25811 Combined forms of age-related cataract, right eye: Secondary | ICD-10-CM | POA: Insufficient documentation

## 2023-07-23 DIAGNOSIS — K219 Gastro-esophageal reflux disease without esophagitis: Secondary | ICD-10-CM | POA: Insufficient documentation

## 2023-07-23 HISTORY — PX: HX CATARACT REMOVAL: SHX102

## 2023-07-23 SURGERY — PHACO WITH INTRAOCULAR LENS
Anesthesia: General | Site: Eye | Laterality: Right | Wound class: Clean Wound: Uninfected operative wounds in which no inflammation occurred

## 2023-07-23 MED ORDER — OFLOXACIN 0.3 % EYE DROPS
1.0000 [drp] | Freq: Four times a day (QID) | OPHTHALMIC | 0 refills | Status: DC
Start: 2023-07-23 — End: 2024-01-21

## 2023-07-23 MED ORDER — BALANCED SALT SOLUTION COMBINATION NO.2 INTRAOCULAR IRRIGATION
Freq: Once | INTRAOCULAR | Status: DC | PRN
Start: 2023-07-23 — End: 2023-07-23
  Administered 2023-07-23: 12 mL via OPHTHALMIC

## 2023-07-23 MED ORDER — BRIMONIDINE 0.2 % EYE DROPS
1.0000 [drp] | Freq: Once | OPHTHALMIC | Status: DC | PRN
Start: 2023-07-23 — End: 2023-07-23
  Administered 2023-07-23: 1 [drp] via OPHTHALMIC
  Filled 2023-07-23: qty 5

## 2023-07-23 MED ORDER — EPINEPHRINE HCL (PF) 1 MG/ML (1 ML) INJECTION SOLUTION
Freq: Once | INTRAOCULAR | Status: DC | PRN
Start: 2023-07-23 — End: 2023-07-23
  Administered 2023-07-23: 320 mL via INTRAOCULAR
  Filled 2023-07-23: qty 1

## 2023-07-23 MED ORDER — LIDOCAINE 1 %-PHENYLEPHRIN 1.5 % IN BALANCED SALT(PF) INTRAOCULAR SOLN
1.0000 mL | INJECTION | Freq: Once | INTRAOCULAR | Status: DC | PRN
Start: 2023-07-23 — End: 2023-07-23
  Administered 2023-07-23: 1 mL via OPHTHALMIC
  Filled 2023-07-23: qty 1

## 2023-07-23 MED ORDER — CEFUROXIME 1 MG/0.1 ML OPHTH INJ
1.0000 mg | INJECTION | Freq: Once | INTRACAMERAL | Status: DC | PRN
Start: 2023-07-23 — End: 2023-07-23
  Administered 2023-07-23: 1 mg via INTRAMUSCULAR
  Filled 2023-07-23: qty 0.1

## 2023-07-23 MED ORDER — CYCLOPENT 1 %-TROPICAMIDE 1 %-PROPARACAINE 0.1 %-PE-KETOROLAC EYE DROP
1.0000 [drp] | OPHTHALMIC | Status: AC
Start: 2023-07-23 — End: 2023-07-23
  Administered 2023-07-23 (×4): 1 [drp] via OPHTHALMIC
  Filled 2023-07-23: qty 1

## 2023-07-23 MED ORDER — TETRACAINE 0.5 % EYE DROPS
1.0000 [drp] | Freq: Once | OPHTHALMIC | Status: DC | PRN
Start: 2023-07-23 — End: 2023-07-23
  Administered 2023-07-23: 1 [drp] via OPHTHALMIC
  Filled 2023-07-23: qty 100

## 2023-07-23 MED ORDER — MOXIFLOXACIN 0.5 % EYE DROPS
1.0000 [drp] | OPHTHALMIC | Status: AC
Start: 2023-07-23 — End: 2023-07-23
  Administered 2023-07-23 (×4): 1 [drp] via OPHTHALMIC
  Filled 2023-07-23: qty 3

## 2023-07-23 MED ORDER — PREDNISOLONE ACETATE 1 % EYE DROPS,SUSPENSION
1.0000 [drp] | Freq: Four times a day (QID) | OPHTHALMIC | 2 refills | Status: DC
Start: 2023-07-23 — End: 2024-01-21

## 2023-07-23 MED ORDER — TIMOLOL MALEATE 0.5 % EYE DROPS
1.0000 [drp] | Freq: Once | OPHTHALMIC | Status: DC | PRN
Start: 2023-07-23 — End: 2023-07-23
  Administered 2023-07-23: 1 [drp] via OPHTHALMIC
  Filled 2023-07-23: qty 5

## 2023-07-23 SURGICAL SUPPLY — 17 items
CONV USE 48189 - SYRINGE MONOJECT 1ML LF  STRL REG TIP GRAD TB SIL POLYPROP EPOXY STD DISP (MED SURG SUPPLIES) ×1 IMPLANT
CONV USE ITEM 321863 - GLOVE SURG 6.5 LF PF SMTH BE_AD CUF STRL NTR 12IN PROTEXIS (GLOVES AND ACCESSORIES) ×2 IMPLANT
DEVICE VISCOELAS DUOVISC KIT SOL (OPHTHALMIC SUPPLIES (NOT LENS)) ×1 IMPLANT
EXPANDER OPTH 7MM MALYUGIN RING PUPIL DISP INJECTOR STRL (MED SURG SUPPLIES) IMPLANT
GLOVE SURG 7.5 LF  PF BEAD CUF STRL CRM 11.8IN PROTEXIS PI PLISPRN THK9.1 MIL (GLOVES AND ACCESSORIES) ×1 IMPLANT
GOWN SURG XL L3 REINF SET IN SLEEVE STRL LF  DISP BLU ASTND FBRC (DRAPE/PACKS/SHEETS/OR TOWEL) ×1 IMPLANT
LENS IOL +18.5 DIOP AUTONOME CLAREON ASPH HDRPHB ACRL STRL LF (IMPLANTS INTER-OCULAR LENS) ×1 IMPLANT
NEEDLE FILTER 1.5IN 18GA BLUNT BVL LL HUB DEHP-FR BD STRL LF  PURP PLYCRB 5UM REG WL DISP (MED SURG SUPPLIES) ×1 IMPLANT
NEEDLE HYPO  18GA 1IN MAGELLAN SHIELD 1 HAND ACT (MED SURG SUPPLIES) ×2 IMPLANT
PACK CUSTOM CATARACT ALCON (CUSTOM TRAYS & PACK) ×1 IMPLANT
SHIELD EYE 6.75X17IN 5.75IN ASST TWR TIDISHIELD RSPS DISPENSR LEN FRM DISP LF (OPHTHALMIC SUPPLIES (NOT LENS)) ×1 IMPLANT
SOL IRRG 0.9% NACL 1000ML PLASTIC PR BTL ISTNC N-PYRG STRL LF (MEDICATIONS/SOLUTIONS) ×1 IMPLANT
SOL IRRG BSS MLT ELCLY GLS CONTAINR 500ML (MED SURG SUPPLIES) ×1 IMPLANT
SOL IRRG BSS PLAIN 15ML STRL L (MED SURG SUPPLIES) IMPLANT
SOL IV 0.9% NACL 10ML DEHP-FR PRSV FR 1 DS PLASTIC FLIPTOP VIAL USP LF  LIGHT GRN (MEDICATIONS/SOLUTIONS) ×1 IMPLANT
SOL PREP BDINE 10% PVP 4OZ BTL PREOP SKIN PREP (MED SURG SUPPLIES) ×1 IMPLANT
SYRINGE LL 3ML LF  STRL GRAD N-PYRG DEHP-FR PVC FREE MED DISP CLR (MED SURG SUPPLIES) ×1 IMPLANT

## 2023-07-23 NOTE — Discharge Summary (Signed)
Discharge Note  Patient meets discharge criteria  Discharge to home  Return to [facility] as scheduled or sooner if need be  Keep surgical dressing in place    Kathleen Argue, MD 07/23/2023, 14:45

## 2023-07-23 NOTE — Anesthesia Postprocedure Evaluation (Signed)
Anesthesia Post Op Evaluation    Patient: Brian Stanley  Procedure(s):  [facility] WITH INTRAOCULAR LENS    Last Vitals:Temperature: 36.3 C (97.4 F) (07/23/23 1445)  Heart Rate: 69 (07/23/23 1445)  BP (Non-Invasive): 137/75 (07/23/23 1445)  Respiratory Rate: 12 (07/23/23 1445)  SpO2: 98 % (07/23/23 1445)    No notable events documented.    Patient is sufficiently recovered from the effects of anesthesia to participate in the evaluation and has returned to their pre-procedure level.  Patient location during evaluation: PACU       Patient participation: complete - patient participated  Level of consciousness: awake and alert and responsive to verbal stimuli    Pain management: adequate  Airway patency: patent    Anesthetic complications: no  Cardiovascular status: acceptable  Respiratory status: acceptable  Hydration status: acceptable  Patient post-procedure temperature: Pt Normothermic   PONV Status: Absent

## 2023-07-23 NOTE — Anesthesia Preprocedure Evaluation (Signed)
ANESTHESIA PRE-OP EVALUATION  Planned Procedure: PHACO WITH INTRAOCULAR LENS (Right: Eye)  Review of Systems     anesthesia history negative     patient summary reviewed  nursing notes reviewed        Pulmonary  negative pulmonary ROS,    Cardiovascular    Hypertension, past MI (in the 80's) and ECG reviewed ,No peripheral edema,        GI/Hepatic/Renal    GERD and well controlled        Endo/Other      type 2 diabetes/ controlled/ controlled with insulin/ controlled with oral medications    Neuro/Psych/MS    back abnormality, Neck problems    peripheral neuropathy,  Cancer  CA,                 Physical Assessment      Airway       Mallampati: II    TM distance: >3 FB    Neck ROM: limited  Mouth Opening: good.  No Facial hair  No Beard  No endotracheal tube present  No Tracheostomy present    Dental                    Pulmonary    Breath sounds clear to auscultation  (-) no rhonchi, no decreased breath sounds, no wheezes, no rales and no stridor     Cardiovascular    Rhythm: regular  Rate: Normal  (-) no friction rub, carotid bruit is not present, no peripheral edema and no murmur     Other findings  Fair dental        Plan  ASA 3     Planned anesthesia type: general     total intravenous anesthesia                    Intravenous induction     Anesthesia issues/risks discussed are: Post-op Agitation/Tantrum, Stroke, Aspiration, Difficult Airway, PONV, Cardiac Events/MI, Intraoperative Awareness/ Recall, Blood Loss and Sore Throat.  Anesthetic plan and risks discussed with patient  signed consent obtained          Patient's NPO status is appropriate for Anesthesia.           Plan discussed with CRNA.

## 2023-07-23 NOTE — H&P (Signed)
[facility]                                                     H&P UPDATE FORM    Patient:  Brian Stanley  Med Record # [id number]  Date of Birth:   01-09-1951  Age:  73 y.o.   Gender: male    Date of Admission: 07/23/2023    Outpatient Pre-Surgical H & P updated the day of the procedure.  1.I reviewed the H&P completed in 30 days of today's surgical procedure by me on  07/02/2023. Assessment remains unchanged based on completion of today's re-assessment.        Change in medications: No      Last Menstrual Period: No/not applicable      Comments:     2.  Patient continues to be appropiate candidate for planned surgical procedure. YES    Kathleen Argue, MD 07/23/2023, 14:11

## 2023-07-23 NOTE — Progress Notes (Signed)
Procedure: Phacoemulsification of cataract with lens implant right eye(s).    Pre-op diagnosis: Cataract right eye(s).      The patient has been thoroughly counseled as to the indications, alternatives, and potential risks of the procedures including, but not limited to, failure of operation, need for further surgery, scar, complications from anesthesia, loss of vision, infection, bleeding, corneal abrasion.    The patient understands the procedure with its risks and benefits, and desires to proceed as outlined above.    Kathleen Argue, MD 07/23/2023, 14:11

## 2023-07-23 NOTE — Anesthesia Transfer of Care (Signed)
ANESTHESIA TRANSFER OF CARE   Brian Stanley is a 73 y.o. ,male, Weight: 86.6 kg (191 lb)   had Procedure(s):  PHACO WITH INTRAOCULAR LENS  performed  07/23/23   Primary Service: Kathleen Argue, MD    Past Medical History:   Diagnosis Date   . Acute recurrent maxillary sinusitis    . Arthritis    . Back problem    . Basal cell carcinoma of back    . Cancer (CMS HCC)     skin cancer   . Constipation    . Esophageal reflux    . Glycosuria    . Hypertension    . Meralgia paresthetica of left side    . MI (myocardial infarction) (CMS HCC)    . Neck problem    . Post-traumatic osteoarthritis of left knee    . Proteinuria    . Type 2 diabetes mellitus (CMS HCC)    . Wears glasses       Allergy History as of 07/23/23       METFORMIN         Noted Status Severity Type Reaction    07/09/23 0741 Quin[place] Simmonds, [place] 09/02/19 Deleted Low  Diarrhea    01/02/22 0740 Quincy Simmonds, [place] 09/02/19 Active Low  Diarrhea              MILK         Noted Status Severity Type Reaction    07/09/23 0741 Quincy Simmonds, [place] 03/18/16 Deleted Medium   Other Adverse Reaction (Add comment)    Comments: Lactose Intolerance? GI symptoms     01/02/22 1250 Quincy Simmonds, [place] 03/18/16 Active Medium   Other Adverse Reaction (Add comment)    Comments: Lactose Intolerance? GI symptoms     01/02/22 1250 Quincy Simmonds, [place] 03/18/16 Active High   Other Adverse Reaction (Add comment)    Comments: Lactose Intolerance? GI symptoms     01/02/22 1248 Quincy Simmonds, [place] 03/18/16 Active    Other Adverse Reaction (Add comment)    Comments: Lactose Intolerance? GI symptoms     01/02/22 0740 Quincy Simmonds, [place] 03/18/16 Active                 ATORVASTATIN         Noted Status Severity Type Reaction    01/08/22 0820 Quincy Simmonds, [place] 09/02/19 Active Medium  Myalgia    Comments: Other reaction(s): Muscle pain     01/02/22 1250 Quincy Simmonds, [place] 09/02/19 Active Medium  Myalgia    Comments: Other reaction(s): Muscle pain     01/02/22  1250 Quincy Simmonds, [place] 09/02/19 Active   Myalgia    Comments: Other reaction(s): Muscle pain     01/02/22 0740 Quincy Simmonds, [place] 09/02/19 Active       Comments: Other reaction(s): Muscle pain               PRAVASTATIN         Noted Status Severity Type Reaction    07/09/23 0741 Quincy Simmonds, [place] 09/02/19 Deleted Medium  Myalgia    Comments: Other reaction(s): Muscle pain     01/08/22 0820 Quincy Simmonds, [place] 09/02/19 Active Medium  Myalgia    Comments: Other reaction(s): Muscle pain     01/02/22 1249 Quincy Simmonds, [place] 09/02/19 Active Medium  Myalgia    Comments: Other reaction(s): Muscle pain     01/02/22 0740 Quincy Simmonds, [place] 09/02/19 Active       Comments: Other reaction(s): Muscle pain  ROSUVASTATIN         Noted Status Severity Type Reaction    01/08/22 0820 Quincy Simmonds, [place] 01/12/20 Active Medium  Myalgia    Comments: Other reaction(s): Muscle pain     01/02/22 1251 Quincy Simmonds, [place] 01/12/20 Active Medium  Myalgia    Comments: Other reaction(s): Muscle pain     01/02/22 0740 Quincy Simmonds, [place] 01/12/20 Active       Comments: Other reaction(s): Muscle pain               SIMVASTATIN         Noted Status Severity Type Reaction    01/08/22 0820 Quincy Simmonds, [place] 09/02/19 Active Medium  Myalgia    Comments: Other reaction(s): Muscle pain     01/02/22 1249 Quincy Simmonds, [place] 09/02/19 Active Medium  Myalgia    Comments: Other reaction(s): Muscle pain     01/02/22 1249 Quincy Simmonds, [place] 09/02/19 Active   Myalgia    Comments: Other reaction(s): Muscle pain     01/02/22 0740 Quincy Simmonds, [place] 09/02/19 Active       Comments: Other reaction(s): Muscle pain               SILDENAFIL         Noted Status Severity Type Reaction    01/08/22 0820 Quincy Simmonds, [place] 04/18/16 Active Medium   Other Adverse Reaction (Add comment)    Comments: Other reaction(s): Headache     01/02/22 1251 Quincy Simmonds, [place] 04/18/16 Active Medium   Other Adverse  Reaction (Add comment)    Comments: Other reaction(s): Headache     01/02/22 0740 Quincy Simmonds, [place] 04/18/16 Active       Comments: Other reaction(s): Headache                   I completed my transfer of care / handoff to the receiving personnel during which we discussed:  Access, Airway, All key/critical aspects of case discussed, Analgesia, Antibiotics, Expectation of post procedure, Fluids/Product, Gave opportunity for questions and acknowledgement of understanding, Labs and PMHx      Post Location: PACU                        Additional Info:Patient transferred to PACU, connected to monitor, vitals stable                                   Last OR Temp: Temperature: 36.3 C (97.4 F)  ABG:   Airway:* No LDAs found *  Blood pressure 137/75, pulse 69, temperature 36.3 C (97.4 F), resp. rate 12, height 1.854 m (6\' 1" ), weight 86.6 kg (191 lb), SpO2 98%.

## 2023-07-23 NOTE — Discharge Instructions (Signed)
SURGICAL DISCHARGE INSTRUCTIONS     Dr. Kathleen Argue, MD  performed your [facility] WITH INTRAOCULAR LENS today at the [facility]   [facility]    [place] [facility] [place]  Fridays 7:30 am - 4:30 pm  [phone]                                 These instructions have been reviewed with the patient and appropriate questions have answers.  Lenard Forth, RN 07/23/2023 14:49                                                 PLEASE SEE WRITTEN HANDOUTS AS DISCUSSED BY YOUR NURSE:      SIGNS AND SYMPTOMS OF A WOUND / INCISION INFECTION   Be sure to watch for the following:  Increase in redness or red streaks near or around the wound or incision.  Increase in pain that is intense or severe and cannot be relieved by the pain medication that your doctor has given you.  Increase in swelling that cannot be relieved by elevation of a body part, or by applying ice, if permitted.  Increase in drainage, or if yellow / green in color and smells bad. This could be on a dressing or a cast.  Increase in fever for longer than 24 hours, or an increase that is higher than 101 degrees Fahrenheit (normal body temperature is 98 degrees Fahrenheit). The incision may feel warm to the touch.    **CALL YOUR DOCTOR IF ONE OR MORE OF THESE SIGNS / SYMPTOMS SHOULD OCCUR.    ANESTHESIA INFORMATION   LOCAL ANESTHETIC:  You have receieved a local anesthetic, the effects should disappear in a few hours.    REMEMBER   If you experience any difficulty breathing, chest pain, bleeding that you feel is excessive, persistent nausea or vomiting or for any other concerns:  Call your physician Dr.  Kathleen Argue, MD or [phone] You may also ask to have the general doctor on call paged. They are available to you 24 hours a day.    SPECIAL INSTRUCTIONS / COMMENTS       FOLLOW-UP APPOINTMENTS   Please call your surgeon's office at the number listed about  to schedule a date / time of return.

## 2023-07-23 NOTE — OR Surgeon (Signed)
[facility]   DEPARTMENT OF OPHTHALMOLOGY   OPERATION SUMMARY     PATIENT NAME: Brian Stanley   HOSPITAL NUMBER: [id number]   DATE OF SERVICE: 07/23/2023   DATE OF BIRTH: 20-Feb-1951    PREOPERATIVE DIAGNOSIS: Cataract, right eye.Combined  H25.811    NAME OF PROCEDURE: Phacoemulsification of cataract with posterior chamber lens implant, right eye.  Cataract Removal with Implant   CPT [phone]  SURGEONS: Kathleen Argue MD (staff),  (assistant).       ANESTHESIA: MAC, topical.   ESTIMATED BLOOD LOSS: Less than 1 drop.   COMPLICATIONS: There were no complications.   SPECIMENS: None.     DESCRIPTION OF PROCEDURE: In the preanesthesia area, the patient was given a drop of topical betadine and topical Tetracaine. The patient was taken back to the operating room suite.  Topical  Akten (lidocaine gel) was applied.  A surgical Time-Out confirmed the correct patient, procedure, operative eye, and IOL model and power.  The eye was then prepped and draped in the usual sterile fashion. A speculum was placed in the eye. A disposable supersharp blade was used to make a 1-mm side port entry through clear cornea along the limbus, and 1 mL of 1% preservative-free Xylocaine was irrigated into the anterior chamber. The anterior chamber was filled with viscoelastic material. The keratome was used to make a 2.4-mm phaco port incision through clear cornea temporally along the limbus. The cystotome and capsulorrhexis forceps were used to create a continuous curvilinear capsulorrhexis. Hydrodissection was performed with balanced salt solution on a cannula. The tip of the phacoemulsification handpiece was introduced into the eye, and the lens nucleus was emulsified using a bimanual nuclear splitting technique.  The phaco tip was removed and the tip of the I/A handpiece introduced into the eye. The remaining lens cortical material was aspirated from the eye. The I/A tip was removed, and the capsular bag was inflated with  viscoelastic material. The posterior chamber lens implant, [id number], of +18.50 diopters power was brought onto the field and inspected. It was without defect and was loaded into the lens injector. The tip of the injector was inserted into the eye, and the lens implant was placed in the capsular bag. The injector tip was removed and the tip of the I/A handpiece reintroduced into the eye. The remaining viscoelastic material was aspirated. The I/A tip was removed, and the anterior chamber was pressurized with balanced salt solution. The anterior wound margins were hydrated with balanced salt solution and 1mg  of Zinacef was instilled into the anterior chamber.  Weck-cel sponges were gently brushed against the incision and confirmed that there was no wound leakage.  The speculum was removed from the eye, and the eye was bandaged with a rigid shield. The patient was returned to same day surgery in excellent condition. Dr. Christell Constant was present for the key and critical portions of the case and immediately available at all times.        Resident   [facility] Op[facility]     Brett Fairy, MD   Assistant Professor   [facility] Department of Ophthalmology

## 2023-07-24 ENCOUNTER — Ambulatory Visit (INDEPENDENT_AMBULATORY_CARE_PROVIDER_SITE_OTHER): Payer: Self-pay | Admitting: Ophthalmology

## 2023-08-07 ENCOUNTER — Other Ambulatory Visit: Payer: Self-pay

## 2023-08-07 ENCOUNTER — Ambulatory Visit: Payer: Medicare PPO | Attending: Ophthalmology | Admitting: Ophthalmology

## 2023-08-07 ENCOUNTER — Ambulatory Visit (INDEPENDENT_AMBULATORY_CARE_PROVIDER_SITE_OTHER): Payer: Self-pay | Admitting: Ophthalmology

## 2023-08-07 ENCOUNTER — Encounter (INDEPENDENT_AMBULATORY_CARE_PROVIDER_SITE_OTHER): Payer: Self-pay | Admitting: Ophthalmology

## 2023-08-07 DIAGNOSIS — Z961 Presence of intraocular lens: Secondary | ICD-10-CM | POA: Insufficient documentation

## 2023-08-07 DIAGNOSIS — Z48810 Encounter for surgical aftercare following surgery on the sense organs: Secondary | ICD-10-CM | POA: Insufficient documentation

## 2023-08-07 DIAGNOSIS — E1136 Type 2 diabetes mellitus with diabetic cataract: Secondary | ICD-10-CM | POA: Insufficient documentation

## 2023-08-07 DIAGNOSIS — H25811 Combined forms of age-related cataract, right eye: Secondary | ICD-10-CM | POA: Insufficient documentation

## 2023-08-07 NOTE — Progress Notes (Addendum)
 [name]  [address]   [place] [address]  Operated by [facility]         Patient Name: Brian Stanley  MRN#: [id number]  Birthdate: Mar 20, 1951    Date of Service: 08/07/2023    Chief Complaint    Post-op Eye         Brian Stanley is a 73 y.o. male who presents today for evaluation/consultation of:  HPI       Post-op Eye     Additional comments: Pt is here for a 2 week post op for cata removal OD  Pt denies pain, fever, and swellings  Pt is using drops  Pt complains of waking up with very dry mouth             Last edited by Murtis Sink, COA on 08/07/2023  1:40 PM.        ROS    Positive for: Eyes  Negative for: Constitutional, Gastrointestinal, Neurological, Skin, Genitourinary, Musculoskeletal, HENT, Endocrine, Cardiovascular, Respiratory, Psychiatric, Allergic/Imm, Heme/Lymph  Last edited by Murtis Sink, COA on 08/07/2023  1:40 PM.         All other systems Negative    Murtis Sink, COA  08/07/2023, 13:40  Base Eye Exam       Visual Acuity (Snellen - Linear)         Right Left    Dist cc 20/40 20/40              Tonometry (Tonopen, 1:44 PM)         Right Left    Pressure 11 13                  Slit Lamp and Fundus Exam       Slit Lamp Exam         Right Left    Cornea Clear corneal incision Clear corneal incision    Anterior Chamber Deep and quiet Deep and quiet    Iris  dilated    Lens Posterior chamber intraocular lens Posterior chamber intraocular lens                    MD Addition to HPI: Patient here for post op visit #3 bilateral. Patient reports successful completion of drop regimen since last visit.         Satisfactory result of phacoemulsification of cataract with lens implant bilateral eye.    ICD-10-CM    1. Pseudophakia, both eyes  Z96.1         Plan:No manifest and back to [place] for  refraction dispensed. Patient to return to [facility] as scheduled or prn.    Not recorded              I reviewed and made  necessary changes to the technician, resident or fellow note regarding CC/HPI/ROS/Past Family, Medical and Social Hx/Surg Hx.    Kathleen Argue, MD  08/07/2023, 13:50

## 2023-08-13 ENCOUNTER — Ambulatory Visit (INDEPENDENT_AMBULATORY_CARE_PROVIDER_SITE_OTHER): Payer: 59 | Admitting: NEUROLOGY

## 2023-08-13 ENCOUNTER — Encounter (INDEPENDENT_AMBULATORY_CARE_PROVIDER_SITE_OTHER): Payer: Self-pay | Admitting: NEUROLOGY

## 2023-08-13 ENCOUNTER — Other Ambulatory Visit: Payer: Self-pay

## 2023-08-13 VITALS — BP 151/66 | HR 75 | Temp 96.3°F | Wt 190.5 lb

## 2023-08-13 DIAGNOSIS — R208 Other disturbances of skin sensation: Secondary | ICD-10-CM

## 2023-08-13 NOTE — Progress Notes (Signed)
 Assessment & Plan  Dysesthesia of scalp  He is a 73 year old man who follows-up for 2 year history of scalp pain. Symptoms seemed to have been insidious in onset though have been stable for some time. They are characterized by constant burning sensation as if scalp has been sunburned. He has been seen by dermatology and tried multiple therapies that have been ineffective. It is unclear to me whether this truly represents pain from a neuropathic source as he reports worsening with any kind of sun exposure. Considerations would include occipital neuralgia though there is no tenderness in the occiput suggestive of this. MRI of the cervical spine showed multilevel degenerative changes but nothing in the high cervical spine that might account for his symptoms. At last visit we trialed gabapentin which did not have any clear effect. At this point I don't know that any other neuropathic pain management would be helpful. He would prefer not to try any medications at this point and follow-up PRN.      - no further neurologic workup at this time     RTC PRN    Thank you for allowing me to participate in your patient's care and please do not hesitate to contact me for any questions or concerns.    Patrick North, DO  Assistant Professor of Neurology  [facility]     I personally spent a total of 30 minutes today preparing to see the patient, in the encounter with the patient, and documenting after the visit.    ==========================================================================================================================================    NAME:  Brian Stanley  DOB:  Oct 31, 1950  VISIT DATE:  03/05/2023    CC:  scalp pain    Patient seen in consultation at the request of Dr. Hewitt Shorts  History obtained from the patient and chart/records  Age of patient:  73 y.o.    INTERVAL: Since last visit, symptoms are unchanged. He trialed gabapentin without clear effect. Scalp  continues to be sensitive particularly in sunlight. He otherwise has no new concerns today.     HPI:   I had the pleasure of seeing your patient in neurology clinic for an outpatient consultation, who is a 73 y.o. year old male who was referred for evaluation of scalp pain.  Please allow me to summarize the history for the record.    Symptoms began 2 years ago insidiously. He describes burning pain in the top of the head in a circumferential pattern on the crown of his head. Describes it as feeling like his head is permanently sunburned. It has been stable for at least a year or more. He reports that if the sun hits it the pain worsens. He has been seen by dermatology and reports trialing phototherapy as well as different topical agents to no effect. He denies similar symptoms elsewhere though does have numbness in the feet that he attributes to his diabetes. He has been diabetic since 2000 with previous A1c's as high as 10. He also reports history of neck pain with degenerative cervical spine changes. He sees a [facility] regularly.     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    Ingrowing nail    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)    Internal hemorrhoids    Keratoconus    Low back pain    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)    Microscopic hematuria    Cervicalgia    Neuropathy (CMS HCC)    Nail dystrophy     Osteoarthrosis    Other seborrheic keratosis    Palpitations    Paresthesia of skin    Sensorineural hearing loss, bilateral    Spinal stenosis, lumbar region with neurogenic claudication    Sprain and strain of other specified sites of knee and leg    Status post left knee replacement    Tinea unguium    Type 2 diabetes mellitus with diabetic neuropathy, unspecified (CMS HCC)    Unspecified open wound of right back wall of thorax without penetration into thoracic cavity, initial encounter    Vitamin B12 deficiency (non anemic)    Vitamin D deficiency    Major depression, recurrent (CMS HCC)    Hyperlipidemia, mixed    Spondylolisthesis    Internal derangement of knee joint, left    Displacement of lumbar intervertebral disc without myelopathy    Lumbago with sciatica, right side    Other specified disorders of the skin and subcutaneous tissue    Seborrheic dermatitis, unspecified    Essential hypertension    Exposure to potentially hazardous substance    Hypertensive heart disease without heart failure    Hematuria    Counseling, unspecified    Hypomagnesemia     Past Surgical History:   Procedure Laterality Date    CATARACT EXTRACTION Left     HX APPENDECTOMY      HX BACK SURGERY      HX KNEE REPLACMENT      left         Family Medical History:       Problem Relation (Age of Onset)    Carpal tunnel syndrome Sister    Depression Mother    Diabetes type II Father    Elevated Lipids Father    Heart Attack Father    Hypertension (High Blood Pressure) Father    Stroke Mother            Current Outpatient Medications   Medication Sig Dispense Refill    aspirin (ECOTRIN) 81 mg Oral Tablet, Delayed Release (E.C.) Take 1 Tablet (81 mg total) by mouth      CHELATED ZINC ORAL Take 50 mg by mouth Once a day      cyanocobalamin (VITAMIN B 12) 1,000 mcg Oral Tablet Take 1 Tablet (1,000 mcg total) by mouth Once a day      empagliflozin (JARDIANCE) 25 mg Oral Tablet Take 0.5 Tablets (12.5 mg total) by mouth Every morning       Folic Acid 800 mcg Oral Tablet Take 1 Tablet (800 mcg total) by mouth      insulin glargine-yfgn 100 Units/mL Subcutaneous Insulin Pen Inject 15 Units under the skin Every morning      lidocaine (LIDODERM) 5 % Adhesive Patch, Medicated Place on the skin      magnesium Oxide 420 mg Oral Tablet Take 1 Tablet (420 mg total) by mouth      MULTIVITAMIN ORAL Take 1 Tablet by mouth Once a day  niacin, vitamin B3, (NIACOR) 50 mg Oral Tablet Take 1 Tablet (50 mg total) by mouth Three times daily with meals      ofloxacin (OCUFLOX) 0.3 % Ophthalmic Drops Ophthalmic Solution Administer 1-2 Drops into the left eye Four times a day 5 mL 0    ofloxacin (OCUFLOX) 0.3 % Ophthalmic Drops Ophthalmic Solution Administer 1 Drop into the right eye Four times a day 5 mL 0    prednisoLONE acetate (PRED FORTE) 1 % Ophthalmic Drops, Suspension Administer 1 Drop into the left eye Four times a day 5 mL 0    prednisoLONE acetate (PRED FORTE) 1 % Ophthalmic Drops, Suspension Administer 1 Drop into the right eye Four times a day 5 mL 2    semaglutide 2 mg/dose (8 mg/3 mL) Subcutaneous Pen Injector Inject 1 mg under the skin Every 7 days      solifenacin (VESICARE) 10 mg Oral Tablet Take 1 Tablet (10 mg total) by mouth Once a day 90 Tablet 3    tamsulosin (FLOMAX) 0.4 mg Oral Capsule Take 1 Capsule (0.4 mg total) by mouth Every night 90 Capsule 2     No current facility-administered medications for this visit.     Allergies   Allergen Reactions    Atorvastatin Myalgia     Other reaction(s): Muscle pain    Rosuvastatin Myalgia     Other reaction(s): Muscle pain    Sildenafil  Other Adverse Reaction (Add comment)     Other reaction(s): Headache    Simvastatin Myalgia     Other reaction(s): Muscle pain     Social History     Socioeconomic History    Marital status: Married     Spouse name: Not on file    Number of children: Not on file    Years of education: Not on file    Highest education level: Not on file   Occupational History    Not on  file   Tobacco Use    Smoking status: Never    Smokeless tobacco: Never   Vaping Use    Vaping status: Never Used   Substance and Sexual Activity    Alcohol use: Yes     Comment: occasionally    Drug use: Never    Sexual activity: Not on file   Other Topics Concern    Not on file   Social History Narrative    Not on file     Social Determinants of Health     Financial Resource Strain: Not on file   Transportation Needs: Not on file   Social Connections: Not on file   Intimate Partner Violence: Not on file   Housing Stability: Not on file       ============================================================================================================================================  GENERAL EXAMINATION  BP (!) 151/66 (Site: Left Arm, Patient Position: Sitting, Cuff Size: Adult)   Pulse 75   Temp (!) 35.7 C (96.3 F) (Temporal)   Wt 86.4 kg (190 lb 8 oz)   SpO2 96%   BMI 25.13 kg/m     Vital signs personally reviewed  General: No acute distress, alert  HEENT: Normocephalic, no scleral icterus  Extremities: No significant edema, No cyanosis    NEUROLOGIC EXAM  On neurological exam, patient was awake, alert and answering questions appropriately  Speech was fluent, without dysarthria or aphasia.    CN  II: not directly tested, grossly intact  III, IV, VI: extraocular movements intact without nystagmus  V: intact to light touch (there is dysesthesia beginning in what  would seem like V1 distribution on forehead but extending past where territory typically encompasses).  VII: face symmetric without weakness  VIII: grossly intact  IX, X: symmetric palatal elevation  XI: normal strength of trapezius and sternocleidomastoid bilaterally  XII: tongue midline with full movements    MOTOR  Bulk: normal  Abnormal Movements: none    Strength: Formal testing deferred, patient moving all limbs equally and antigravity    REFLEXES  deferred    GAIT  General: wide  based    ================================================================================================================================LABS  Personal Review of prior labs is notable for:   2024  A1c 6.8  LDL 110  B12 211  LFTs WNL   IMAGING  Personal Review of imaging is notable for:   MRI Cervical Spine October 2024 - multilevel degenerative changes worse at C4-6. No high cervical issues to explain symptoms    NCHCT June 2022 global atrophy    OTHER DIAGNOSTICS  Personal Review of other prior diagnostics is notable for: not applicable

## 2023-09-03 ENCOUNTER — Other Ambulatory Visit (INDEPENDENT_AMBULATORY_CARE_PROVIDER_SITE_OTHER): Payer: Self-pay | Admitting: Student in an Organized Health Care Education/Training Program

## 2023-09-03 MED ORDER — SOLIFENACIN 10 MG TABLET
10.0000 mg | ORAL_TABLET | Freq: Every day | ORAL | 3 refills | Status: DC
Start: 2023-09-03 — End: 2024-03-10

## 2023-12-03 ENCOUNTER — Encounter (INDEPENDENT_AMBULATORY_CARE_PROVIDER_SITE_OTHER): Payer: Self-pay | Admitting: Student in an Organized Health Care Education/Training Program

## 2024-01-21 ENCOUNTER — Other Ambulatory Visit: Payer: Self-pay

## 2024-01-21 ENCOUNTER — Encounter (INDEPENDENT_AMBULATORY_CARE_PROVIDER_SITE_OTHER): Payer: Self-pay | Admitting: Family Medicine

## 2024-01-21 ENCOUNTER — Other Ambulatory Visit (INDEPENDENT_AMBULATORY_CARE_PROVIDER_SITE_OTHER): Payer: Self-pay

## 2024-01-21 ENCOUNTER — Ambulatory Visit: Payer: Self-pay | Attending: Family Medicine | Admitting: Family Medicine

## 2024-01-21 VITALS — BP 121/53 | HR 68 | Temp 97.9°F | Resp 18 | Ht 73.0 in | Wt 185.0 lb

## 2024-01-21 DIAGNOSIS — T466X5A Adverse effect of antihyperlipidemic and antiarteriosclerotic drugs, initial encounter: Secondary | ICD-10-CM

## 2024-01-21 DIAGNOSIS — H903 Sensorineural hearing loss, bilateral: Secondary | ICD-10-CM

## 2024-01-21 DIAGNOSIS — E1140 Type 2 diabetes mellitus with diabetic neuropathy, unspecified: Secondary | ICD-10-CM

## 2024-01-21 DIAGNOSIS — E538 Deficiency of other specified B group vitamins: Secondary | ICD-10-CM

## 2024-01-21 DIAGNOSIS — I10 Essential (primary) hypertension: Secondary | ICD-10-CM

## 2024-01-21 DIAGNOSIS — G629 Polyneuropathy, unspecified: Secondary | ICD-10-CM

## 2024-01-21 DIAGNOSIS — G720 Drug-induced myopathy: Secondary | ICD-10-CM

## 2024-01-21 DIAGNOSIS — F330 Major depressive disorder, recurrent, mild: Secondary | ICD-10-CM

## 2024-01-21 DIAGNOSIS — E782 Mixed hyperlipidemia: Secondary | ICD-10-CM

## 2024-01-21 DIAGNOSIS — E559 Vitamin D deficiency, unspecified: Secondary | ICD-10-CM

## 2024-01-21 DIAGNOSIS — I119 Hypertensive heart disease without heart failure: Secondary | ICD-10-CM

## 2024-01-21 DIAGNOSIS — M542 Cervicalgia: Secondary | ICD-10-CM

## 2024-01-21 NOTE — Progress Notes (Signed)
 FAMILY MEDICINE, MEDICAL OFFICE BUILDING  [address]  [place] [address]  Operated by [facility]     Name: Brian Stanley MRN:  [id number]   Date: 01/21/2024 Age: 73 y.o.          Provider: Layman Law, DO    Reason for visit: Follow Up 6 Months      History of Present Illness:  01/21/2024:  This 73 year old male returns for six-month follow-up to review his labs and get refills on all his medications he brought in a packet of labs from the [facility] which is 5 copies of the same thing.  He had his cataract removed on the left in his doing well but still has to wear glasses continuously has ringing in his ears does wear hearing aids at times but it does not help he also saw a neurologist for his dysesthesias of his scalp feels like his scalp with sunburn but it maybe do a neuropathy nothing in his helped that either he saw the dermatologist at [facility] and a biopsy has been done his hemoglobin A1c was 6 point hemoglobin was 16 hematocrit 47.7 electrolytes were normal except for glucose of 117 chloride 109 anion gap was slightly low at 8.3 liver enzymes magnesium was normal at 2.4 total cholesterol 192 triglycerides 84 HDL 46 LDL C was 129 B12 was 217 and I told him to start taking B12 supplements.  TSH was 1.510 folate was 15.5 creatinine was 0.9 GFR was 88  Historical Data    Past Medical History:  Past Medical History:   Diagnosis Date    Acute recurrent maxillary sinusitis     Arthritis     Back problem     Basal cell carcinoma of back     Cancer (CMS HCC)     skin cancer    Constipation     Esophageal reflux     Glycosuria     Hypertension     Meralgia paresthetica of left side     MI (myocardial infarction)     Neck problem     Post-traumatic osteoarthritis of left knee     Proteinuria     Type 2 diabetes mellitus     Wears glasses          Past Surgical History:  Past Surgical History:   Procedure Laterality Date    CATARACT EXTRACTION Bilateral      right-07/23/2023 left 06/2023 Dr. [facility]ina [facility]    HX APPENDECTOMY      HX BACK SURGERY      HX KNEE REPLACMENT      left         Allergies:  Allergies[1]  Medications:  Current Outpatient Medications   Medication Sig    aspirin (ECOTRIN) 81 mg Oral Tablet, Delayed Release (E.C.) Take 1 Tablet (81 mg total) by mouth (Patient taking differently: Take 325 mg by mouth Every 7 days)    CHELATED ZINC ORAL Take 50 mg by mouth Once a day    cyanocobalamin (VITAMIN B 12) 1,000 mcg Oral Tablet Take 1 Tablet (1,000 mcg total) by mouth Once a day    empagliflozin (JARDIANCE) 25 mg Oral Tablet Take 0.5 Tablets (12.5 mg total) by mouth Every morning    Folic Acid 800 mcg Oral Tablet Take 1 Tablet (800 mcg total) by mouth    insulin glargine-yfgn 100 Units/mL Subcutaneous Insulin Pen Inject 15 Units under the skin Every morning    lidocaine  (LIDODERM )  5 % Adhesive Patch, Medicated Place on the skin    magnesium Oxide 420 mg Oral Tablet Take 1 Tablet (420 mg total) by mouth    MULTIVITAMIN ORAL Take 1 Tablet by mouth Once a day    niacin, vitamin B3, (NIACOR) 50 mg Oral Tablet Take 1 Tablet (50 mg total) by mouth Three times daily with meals    semaglutide 2 mg/dose (8 mg/3 mL) Subcutaneous Pen Injector Inject 1 mg under the skin Every 7 days    solifenacin  (VESICARE ) 10 mg Oral Tablet Take 1 Tablet (10 mg total) by mouth Daily    tamsulosin  (FLOMAX ) 0.4 mg Oral Capsule Take 1 Capsule (0.4 mg total) by mouth Every night     Family History:  Family Medical History:       Problem Relation (Age of Onset)    Carpal tunnel syndrome Sister    Depression Mother    Diabetes type II Father    Elevated Lipids Father    Heart Attack Father    Hypertension (High Blood Pressure) Father    Stroke Mother            Social History:  Social History     Socioeconomic History    Marital status: Married   Tobacco Use    Smoking status: Never    Smokeless tobacco: Never   Vaping Use    Vaping status: Never Used   Substance and Sexual Activity     Alcohol use: Yes     Comment: occasionally    Drug use: Never           Review of Systems:  Any pertinent Review of Systems as addressed in the HPI above.    Physical Exam:  Vital Signs:  Vitals:    01/21/24 0813   BP: (!) 121/53   Pulse: 68   Resp: 18   Temp: 36.6 C (97.9 F)   TempSrc: Temporal   SpO2: 98%   Weight: 83.9 kg (185 lb)   Height: 1.854 m (6' 1)   BMI: 24.41     Physical Exam  Vitals and nursing note reviewed.   Constitutional:       General: He is awake.      Appearance: Normal appearance. He is well-developed, well-groomed and normal weight.   HENT:      Head: Normocephalic and atraumatic.      Right Ear: Tympanic membrane, ear canal and external ear normal.      Left Ear: Tympanic membrane, ear canal and external ear normal.      Nose: Nose normal.      Mouth/Throat:      Mouth: Mucous membranes are moist.      Pharynx: Oropharynx is clear.   Eyes:      Extraocular Movements: Extraocular movements intact.      Conjunctiva/sclera: Conjunctivae normal.      Pupils: Pupils are equal, round, and reactive to light.   Cardiovascular:      Rate and Rhythm: Normal rate and regular rhythm.      Pulses: Normal pulses.           Carotid pulses are 2+ on the right side and 2+ on the left side.       Radial pulses are 2+ on the right side and 2+ on the left side.        Dorsalis pedis pulses are 2+ on the right side and 2+ on the left side.        Posterior  tibial pulses are 2+ on the right side and 2+ on the left side.      Heart sounds: Normal heart sounds, S1 normal and S2 normal.   Pulmonary:      Effort: Pulmonary effort is normal.      Breath sounds: Normal breath sounds. No decreased breath sounds, wheezing, rhonchi or rales.   Abdominal:      General: Abdomen is protuberant.      Palpations: Abdomen is soft.      Tenderness: There is no abdominal tenderness. There is no right CVA tenderness or left CVA tenderness.   Musculoskeletal:         General: Normal range of motion.      Cervical back: Normal  range of motion and neck supple.      Right lower leg: No edema.      Left lower leg: No edema.   Skin:     General: Skin is warm and dry.      Capillary Refill: Capillary refill takes less than 2 seconds.   Neurological:      General: No focal deficit present.      Mental Status: He is alert and oriented to person, place, and time. Mental status is at baseline.      Cranial Nerves: Cranial nerves 2-12 are intact.      Sensory: Sensory deficit present.      Motor: Motor function is intact.      Coordination: Coordination is intact.      Gait: Gait is intact.      Comments: Scalp dysesthesia   Psychiatric:         Mood and Affect: Mood normal.         Behavior: Behavior normal. Behavior is cooperative.       Assessment:    ICD-10-CM    1. Type 2 diabetes mellitus with diabetic neuropathy, without Kiffany Schelling-term current use of insulin (CMS HCC)  E11.40       2. Neuropathy (CMS HCC)  G62.9       3. 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 Essential hypertension  I10       5. Hyperlipidemia, mixed  E78.2       6. Hypertensive heart disease without heart failure  I11.9       7. Cervicalgia  M54.2       8. Vitamin B12 deficiency (non anemic)  E53.8       9. Vitamin D deficiency  E55.9       10. 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 Hypomagnesemia  E83.42          Plan:  No orders of the defined types were placed in this encounter.    The patient will get his labs [facility] at the [facility].  He will continue magnesium for magnesium deficiency.  He will continue vitamin-D and B12 for vitamin D and B12 deficiency.  He will continue his Jardiance insulin in his semaglutide for his diabetes and I recommend he eat 50% of the meal non starchy vegetables 25% lean protein and 25% starchy vegetables grains cereals beans and legumes for his mixed hyperlipidemia he will continue his niacin in his he has a statin allergy which caused a myopathy.  He will limit  salt to no more than 2 g a day.    Return in about 6 months (around 07/23/2024).  Layman Law, DO     Portions of this note may be dictated using voice recognition software or a dictation service. Variances in spelling and vocabulary are possible and unintentional. Not all errors are caught/corrected. Please notify the dino if any discrepancies are noted or if the meaning of any statement is not clear.        [1]   Allergies  Allergen Reactions    Atorvastatin Myalgia     Other reaction(s): Muscle pain    Rosuvastatin Myalgia     Other reaction(s): Muscle pain    Sildenafil  Other Adverse Reaction (Add comment)     Other reaction(s): Headache    Simvastatin Myalgia     Other reaction(s): Muscle pain    Metformin Diarrhea

## 2024-01-21 NOTE — Nursing Note (Signed)
 01/21/24 0810   PHQ 9 (follow up)   Little interest or pleasure in doing things. 0   Feeling down, depressed, or hopeless 0

## 2024-01-21 NOTE — Nursing Note (Signed)
 01/21/24 0809   Fall Risk Assessment   Do you feel unsteady when standing or walking? Yes  (balance off)   Do you worry about falling? Yes  (tries to be very careful)   Have you fallen in the past year? Yes   How many times have you fallen? Once   Were you ever injured from falling? No

## 2024-01-21 NOTE — Nursing Note (Signed)
 01/21/24 0807   Recent Weight Change   Have you had a recent unexplained weight loss or gain? N   Domestic Violence   Because we are aware of abuse and domestic violence today, we ask all patients: Are you being hurt, hit, or frightened by anyone at your home or in your life?  N   Basic Needs   Do you have any basic needs within your home that are not being met? (such as Food, Shelter, [employer] fast streamer, Tranportation, paying for bills and/or medications) Y  (clothing from weight loss)   Advanced Directives   Do you have any advanced directives? No Advance   Would you like an advanced directive packet? Accepted Packet

## 2024-01-22 ENCOUNTER — Other Ambulatory Visit (INDEPENDENT_AMBULATORY_CARE_PROVIDER_SITE_OTHER): Payer: Self-pay | Admitting: Physician Assistant

## 2024-01-22 DIAGNOSIS — R3912 Poor urinary stream: Secondary | ICD-10-CM

## 2024-01-22 DIAGNOSIS — Z125 Encounter for screening for malignant neoplasm of prostate: Secondary | ICD-10-CM

## 2024-01-26 ENCOUNTER — Encounter (INDEPENDENT_AMBULATORY_CARE_PROVIDER_SITE_OTHER)

## 2024-01-29 ENCOUNTER — Ambulatory Visit (INDEPENDENT_AMBULATORY_CARE_PROVIDER_SITE_OTHER)

## 2024-01-29 ENCOUNTER — Other Ambulatory Visit: Payer: Self-pay

## 2024-01-29 ENCOUNTER — Encounter (INDEPENDENT_AMBULATORY_CARE_PROVIDER_SITE_OTHER): Payer: Self-pay

## 2024-01-29 VITALS — BP 128/58 | HR 71 | Ht 72.0 in | Wt 184.8 lb

## 2024-01-29 DIAGNOSIS — N3281 Overactive bladder: Secondary | ICD-10-CM

## 2024-01-29 DIAGNOSIS — N529 Male erectile dysfunction, unspecified: Secondary | ICD-10-CM

## 2024-01-29 DIAGNOSIS — N401 Enlarged prostate with lower urinary tract symptoms: Secondary | ICD-10-CM

## 2024-01-29 DIAGNOSIS — N400 Enlarged prostate without lower urinary tract symptoms: Secondary | ICD-10-CM

## 2024-01-29 DIAGNOSIS — R351 Nocturia: Secondary | ICD-10-CM

## 2024-01-29 NOTE — Progress Notes (Signed)
 UROLOGY, [facility]  [address] [place] [address]    Progress Note    Name: ZARIAH JOST MRN:  [id number]   Date: 01/29/2024 DOB:  1950/08/31 (73 y.o.)             Chief Complaint: Benign Prostatic Hypertrophy, Erectile Dysfunction, and Overactive Bladder  Subjective   Subjective:   73 year old male presents for follow up.  He has a history of BPH, overactive bladder, and erectile dysfunction.  His main complaint was erectile dysfunction.  He states the Viagra and Cialis  are not working.  He reports urinating normally.  He reports nocturia 1 time per night.  He reports urinating normally throughout the day.     Objective   Objective :    Physical examination:  BP (!) 128/58 (Site: Right Arm, Patient Position: Sitting, Cuff Size: Adult)   Pulse 71   Ht 1.829 m (6')   Wt 83.8 kg (184 lb 12.8 oz)   BMI 25.06 kg/m        General: Well-developed, well-nourished, in no apparent distress.  HEENT: Grossly normal.  Cardiac: Regular rate.  Pulm: No dyspnea  Abdomen:  Nondistended.  GU:  Deferred  Skin: Warm and dry.  Extremities: Moves all extremities.  Neurologic: Alert and oriented.    Data reviewed:    Current Outpatient Medications   Medication Sig    aspirin (ECOTRIN) 81 mg Oral Tablet, Delayed Release (E.C.) Take 1 Tablet (81 mg total) by mouth (Patient taking differently: Take 325 mg by mouth Every 7 days)    CHELATED ZINC ORAL Take 50 mg by mouth Once a day    cyanocobalamin (VITAMIN B 12) 1,000 mcg Oral Tablet Take 1 Tablet (1,000 mcg total) by mouth Once a day    empagliflozin (JARDIANCE) 25 mg Oral Tablet Take 0.5 Tablets (12.5 mg total) by mouth Every morning    Folic Acid 800 mcg Oral Tablet Take 1 Tablet (800 mcg total) by mouth    insulin glargine-yfgn 100 Units/mL Subcutaneous Insulin Pen Inject 15 Units under the skin Every morning    lidocaine  (LIDODERM ) 5 % Adhesive Patch, Medicated Place on the skin    magnesium Oxide 420 mg Oral Tablet Take 1 Tablet (420 mg total) by  mouth    MULTIVITAMIN ORAL Take 1 Tablet by mouth Once a day    niacin, vitamin B3, (NIACOR) 50 mg Oral Tablet Take 1 Tablet (50 mg total) by mouth Three times daily with meals    semaglutide 2 mg/dose (8 mg/3 mL) Subcutaneous Pen Injector Inject 1 mg under the skin Every 7 days    solifenacin  (VESICARE ) 10 mg Oral Tablet Take 1 Tablet (10 mg total) by mouth Daily    tamsulosin  (FLOMAX ) 0.4 mg Oral Capsule Take 1 Capsule (0.4 mg total) by mouth Every night        Assessment/Plan  Problem List Items Addressed This Visit          Urology    Benign prostatic hyperplasia without lower urinary tract symptoms - Primary     73 year old male with a history of BPH, ED, and overactive bladder.  His most recent PSA was 0.62.  We discussed further treatment options for the ED. I recommended trying either penile injection therapy or the vacuum erection device.  He states he will call with his decision.  He will follow up in 1 year.    Lonni Mead, MD

## 2024-03-10 ENCOUNTER — Other Ambulatory Visit (INDEPENDENT_AMBULATORY_CARE_PROVIDER_SITE_OTHER): Payer: Self-pay | Admitting: Physician Assistant

## 2024-03-10 MED ORDER — SOLIFENACIN 10 MG TABLET
10.0000 mg | ORAL_TABLET | Freq: Every day | ORAL | 3 refills | Status: AC
Start: 2024-03-10 — End: ?

## 2024-05-03 ENCOUNTER — Encounter (HOSPITAL_BASED_OUTPATIENT_CLINIC_OR_DEPARTMENT_OTHER): Payer: Self-pay

## 2024-07-12 ENCOUNTER — Ambulatory Visit (INDEPENDENT_AMBULATORY_CARE_PROVIDER_SITE_OTHER): Payer: Self-pay | Admitting: Family Medicine

## 2024-07-25 ENCOUNTER — Ambulatory Visit (INDEPENDENT_AMBULATORY_CARE_PROVIDER_SITE_OTHER): Admitting: Family Medicine

## 2025-01-27 ENCOUNTER — Encounter (INDEPENDENT_AMBULATORY_CARE_PROVIDER_SITE_OTHER): Payer: Self-pay
